# Patient Record
Sex: Female | Born: 1950 | Race: White | Hispanic: No | Marital: Married | Smoking: Former smoker
Health system: Southern US, Community
[De-identification: ages and names within clinical notes are randomized; demographics above are authoritative.]

## PROBLEM LIST (undated history)

## (undated) DIAGNOSIS — I209 Angina pectoris, unspecified: Secondary | ICD-10-CM

## (undated) DIAGNOSIS — K219 Gastro-esophageal reflux disease without esophagitis: Secondary | ICD-10-CM

## (undated) DIAGNOSIS — E039 Hypothyroidism, unspecified: Secondary | ICD-10-CM

## (undated) DIAGNOSIS — R51 Headache: Secondary | ICD-10-CM

## (undated) DIAGNOSIS — C801 Malignant (primary) neoplasm, unspecified: Secondary | ICD-10-CM

## (undated) DIAGNOSIS — I10 Essential (primary) hypertension: Secondary | ICD-10-CM

## (undated) DIAGNOSIS — F32A Depression, unspecified: Secondary | ICD-10-CM

## (undated) DIAGNOSIS — F329 Major depressive disorder, single episode, unspecified: Secondary | ICD-10-CM

## (undated) DIAGNOSIS — M1990 Unspecified osteoarthritis, unspecified site: Secondary | ICD-10-CM

## (undated) HISTORY — PX: AUGMENTATION MAMMAPLASTY: SUR837

## (undated) HISTORY — PX: BACK SURGERY: SHX140

## (undated) HISTORY — PX: ABDOMINAL HYSTERECTOMY: SHX81

## (undated) HISTORY — PX: APPENDECTOMY: SHX54

## (undated) HISTORY — PX: ENDOMETRIAL FULGURATION: SHX1500

---

## 1979-01-16 DIAGNOSIS — C801 Malignant (primary) neoplasm, unspecified: Secondary | ICD-10-CM

## 1979-01-16 HISTORY — DX: Malignant (primary) neoplasm, unspecified: C80.1

## 2004-05-08 ENCOUNTER — Other Ambulatory Visit: Admission: RE | Admit: 2004-05-08 | Discharge: 2004-05-08 | Payer: Self-pay | Admitting: Obstetrics and Gynecology

## 2005-01-22 ENCOUNTER — Inpatient Hospital Stay (HOSPITAL_COMMUNITY): Admission: EM | Admit: 2005-01-22 | Discharge: 2005-01-23 | Payer: Self-pay | Admitting: Emergency Medicine

## 2005-04-20 ENCOUNTER — Encounter: Admission: RE | Admit: 2005-04-20 | Discharge: 2005-04-20 | Payer: Self-pay | Admitting: Family Medicine

## 2006-11-26 ENCOUNTER — Encounter: Admission: RE | Admit: 2006-11-26 | Discharge: 2006-11-26 | Payer: Self-pay | Admitting: Family Medicine

## 2008-02-06 ENCOUNTER — Encounter: Admission: RE | Admit: 2008-02-06 | Discharge: 2008-02-06 | Payer: Self-pay | Admitting: Family Medicine

## 2008-03-20 ENCOUNTER — Ambulatory Visit: Payer: Self-pay | Admitting: Interventional Radiology

## 2008-03-20 ENCOUNTER — Emergency Department (HOSPITAL_BASED_OUTPATIENT_CLINIC_OR_DEPARTMENT_OTHER): Admission: EM | Admit: 2008-03-20 | Discharge: 2008-03-20 | Payer: Self-pay | Admitting: Emergency Medicine

## 2009-03-23 ENCOUNTER — Encounter: Admission: RE | Admit: 2009-03-23 | Discharge: 2009-03-23 | Payer: Self-pay | Admitting: Family Medicine

## 2010-04-27 LAB — POCT CARDIAC MARKERS
Myoglobin, poc: 60.4 ng/mL (ref 12–200)
Troponin i, poc: <0.05 ng/mL (ref 0.00–0.09)

## 2010-04-27 LAB — BASIC METABOLIC PANEL
BUN: 20 mg/dL (ref 6–23)
Calcium: 9.4 mg/dL (ref 8.4–10.5)
Creatinine, Ser: 1 mg/dL (ref 0.4–1.2)
Glucose, Bld: 91 mg/dL (ref 70–99)
Sodium: 141 mEq/L (ref 135–145)

## 2010-04-27 LAB — CBC
HCT: 41.5 % (ref 36.0–46.0)
Hemoglobin: 14.1 g/dL (ref 12.0–15.0)
MCV: 89.2 fL (ref 78.0–100.0)

## 2010-04-27 LAB — DIFFERENTIAL
Eosinophils Absolute: 0.3 10*3/uL (ref 0.0–0.7)
Eosinophils Relative: 3 % (ref 0–5)
Lymphocytes Relative: 36 % (ref 12–46)
Monocytes Absolute: 0.8 10*3/uL (ref 0.1–1.0)
Monocytes Relative: 8 % (ref 3–12)

## 2010-06-02 NOTE — H&P (Signed)
NAMERegan Gilbert               ACCOUNT NO.:  [id number]   MEDICAL RECORD NO.:  [id number]          PATIENT TYPE:  INP   LOCATION:  3705                         FACILITY:  [facility]   PHYSICIAN:  Lyn Records, M.D.   DATE OF BIRTH:  07/11/1950   DATE OF ADMISSION:  01/22/2005  DATE OF DISCHARGE:                                HISTORY & PHYSICAL   REASON FOR ADMISSION:  Chest pressure.   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  She states that last evening while in church and then  again this morning she developed discomfort in her mid chest that radiated  into the left shoulder.  This a.m. it occurred while she was driving her car  to work and had persisted for nearly two hours.  She decided to come to the  emergency room.  In the ER there was no evidence of EKG abnormality or point  of care marker abnormality.  She is admitted because of her history.  There  is a prior evaluation for chest pain that culminated on the cardiac  catheterization six years ago in [place] that was normal.   ALLERGIES:  None.   MEDICATIONS:  1.  Neurontin 1600 mg b.i.d.  2.  Lotrel 5/10 mg per day.  3.  Estrace dose unknown.  4.  Wellbutrin 150 mg b.i.d.  5.  Synthroid 0.5 mg per day.   SIGNIFICANT MEDICAL PROBLEMS:  1.  History of migraines.  2.  Mitral valve prolapse.  3.  Hypertension.  4.  Back pain.   FAMILY HISTORY:  Father died of esophageal cancer at 63.  Mother died at 33  of liver cancer.   REVIEW OF SYSTEMS:  A persisting upper respiratory congestion and drainage  in the past four weeks, otherwise unremarkable.  She does have headaches  from time to time related to her migraines.  The diagnosis of mitral valve  prolapse was made six years ago when she presented with chest pain and had a  normal catheterization.  This was done in [place].   OBJECTIVE:  GENERAL:  Patient is in no acute distress.  She is having some  lingering  chest discomfort.  She states that after some sublingual  nitroglycerin __________ got better.  VITAL SIGNS:  Heart rate 68, respirations 16, blood pressure 110/70.  HEENT:  Unremarkable.  Extraocular movements are full.  NECK:  JVD and carotid bruits.  LUNGS:  Clear to auscultation and percussion.  CARDIAC:  No gallop or rub or click.  No murmur.  ABDOMEN:  Soft.  Bowel sounds normal.  EXTREMITIES:  No edema.  Posterior tibial and radial pulses 2+.  NEUROLOGIC:  Unremarkable.   Chest x-ray is unremarkable.   LABORATORY DATA:  Unremarkable.  EKG is unremarkable.   ASSESSMENT:  Chest pain.  Admit for observation to rule out myocardial  infarction.   PLAN:  Serial enzymes.  Consider stress testing.   ADDENDUM:   HABITS:  Patient smokes cigarettes.  She denies ethanol consumption.      Lyn Records,  M.D.  Electronically Signed     HWS/MEDQ  D:  01/23/2005  T:  01/23/2005  Job:  [id number]   cc:   Dr. Tamera Stands  [place], [place]

## 2010-06-02 NOTE — Discharge Summary (Signed)
NAMERegan Gilbert               ACCOUNT NO.:  [id number]   MEDICAL RECORD NO.:  [id number]          PATIENT TYPE:  INP   LOCATION:  3705                         FACILITY:  [facility]   PHYSICIAN:  Lyn Records, M.D.   DATE OF BIRTH:  25-Oct-1950   DATE OF ADMISSION:  01/22/2005  DATE OF DISCHARGE:  01/23/2005                                 DISCHARGE SUMMARY   DISCHARGE DIAGNOSIS:  1.  Prolonged chest pain, myocardial infarction ruled out, Cardiolite study      negative for ischemia.  2.  History of mitral valve prolapse.  3.  History of migraines.   DIAGNOSTIC STUDIES:  Stress Cardiolite January 23, 2005.   DISCHARGE INSTRUCTIONS:  The patient is to follow up with her primary care  physician, Dr. Tresa Endo.  The patient is advised to take aspirin 81 mg daily.  The patient is advised to discontinue smoking.  The patient is advised to  remain on her previously prescribed medications including Synthroid as  before, Neurontin 1600 mg b.i.d., Wellbutrin 150 mg b.i.d., Lotrel as  before, Estrace as before, and as mentioned above, coated aspirin 81 mg is  added.  The patient is advised to call Dr. Landry Dyke office to make an  appointment.  The patient is advised to re-contact Dr. Katrinka Blazing if there is  significant recurrence of chest pain.   CONDITION ON DISCHARGE:  Improved.   ACTIVITIES:  No restrictions.   HOSPITAL COURSE:  Please refer to the admitting history and physical.  The  patient exercised on the Bruce protocol and performed a stress Cardiolite  study on January 23, 2005.  Exercise tolerance was excellent, there was no  reproduction of any type of chest pain symptoms with exercise, no EKG  evidence of ischemia was noted.  The patient's nitroglycerin was  discontinued, she ambulated in the hall, and was discharged with the  instructions as noted above.  She does have a history of mitral valve  prolapse and it is felt that, perhaps, some of her chest pain could be  related to that,  although there is no evidence of mitral valve prolapse and  an echo was not repeated.  We felt it best for the patient to follow up with  her primary care physician to consider possible other causes.  Her  medications other than the addition of aspirin were not changed.  We  encouraged her to stop smoking.      Lyn Records, M.D.  Electronically Signed     HWS/MEDQ  D:  01/23/2005  T:  01/23/2005  Job:  [id number]   cc:   Dr. Tresa Endo

## 2010-07-14 ENCOUNTER — Other Ambulatory Visit: Payer: Self-pay | Admitting: Family Medicine

## 2010-07-14 DIAGNOSIS — Z1231 Encounter for screening mammogram for malignant neoplasm of breast: Secondary | ICD-10-CM

## 2010-07-24 ENCOUNTER — Ambulatory Visit
Admission: RE | Admit: 2010-07-24 | Discharge: 2010-07-24 | Disposition: A | Discharge status: Home / Self Care | Payer: 59 | Source: Ambulatory Visit | Attending: Family Medicine | Admitting: Family Medicine

## 2010-07-24 DIAGNOSIS — Z1231 Encounter for screening mammogram for malignant neoplasm of breast: Secondary | ICD-10-CM

## 2011-11-01 ENCOUNTER — Other Ambulatory Visit: Payer: Self-pay | Admitting: Family Medicine

## 2011-11-01 DIAGNOSIS — Z1231 Encounter for screening mammogram for malignant neoplasm of breast: Secondary | ICD-10-CM

## 2011-12-04 ENCOUNTER — Ambulatory Visit
Admission: RE | Admit: 2011-12-04 | Discharge: 2011-12-04 | Disposition: A | Discharge status: Home / Self Care | Payer: Medicare Other | Source: Ambulatory Visit | Attending: Family Medicine | Admitting: Family Medicine

## 2011-12-04 DIAGNOSIS — Z1231 Encounter for screening mammogram for malignant neoplasm of breast: Secondary | ICD-10-CM

## 2012-02-16 HISTORY — PX: HIP SURGERY: SHX245

## 2012-11-04 ENCOUNTER — Encounter (HOSPITAL_COMMUNITY): Payer: Self-pay | Admitting: Pharmacy Technician

## 2012-11-05 ENCOUNTER — Encounter (HOSPITAL_COMMUNITY)
Admission: RE | Admit: 2012-11-05 | Discharge: 2012-11-05 | Disposition: A | Discharge status: Home / Self Care | Payer: Medicare Other | Source: Ambulatory Visit | Attending: Orthopedic Surgery | Admitting: Orthopedic Surgery

## 2012-11-05 ENCOUNTER — Encounter (HOSPITAL_COMMUNITY): Payer: Self-pay

## 2012-11-05 DIAGNOSIS — Z01818 Encounter for other preprocedural examination: Secondary | ICD-10-CM | POA: Insufficient documentation

## 2012-11-05 DIAGNOSIS — Z01812 Encounter for preprocedural laboratory examination: Secondary | ICD-10-CM | POA: Insufficient documentation

## 2012-11-05 HISTORY — DX: Angina pectoris, unspecified: I20.9

## 2012-11-05 HISTORY — DX: Major depressive disorder, single episode, unspecified: F32.9

## 2012-11-05 HISTORY — DX: Depression, unspecified: F32.A

## 2012-11-05 HISTORY — DX: Hypothyroidism, unspecified: E03.9

## 2012-11-05 HISTORY — DX: Essential (primary) hypertension: I10

## 2012-11-05 HISTORY — DX: Malignant (primary) neoplasm, unspecified: C80.1

## 2012-11-05 HISTORY — DX: Unspecified osteoarthritis, unspecified site: M19.90

## 2012-11-05 HISTORY — DX: Headache: R51

## 2012-11-05 HISTORY — DX: Gastro-esophageal reflux disease without esophagitis: K21.9

## 2012-11-05 LAB — BASIC METABOLIC PANEL
CO2: 29 mEq/L (ref 19–32)
Chloride: 101 mEq/L (ref 96–112)
Creatinine, Ser: 1.02 mg/dL (ref 0.50–1.10)
GFR calc Af Amer: 67 mL/min — ABNORMAL LOW (ref >90)
Potassium: 4.1 mEq/L (ref 3.5–5.1)
Sodium: 135 mEq/L (ref 135–145)

## 2012-11-05 LAB — URINALYSIS, ROUTINE W REFLEX MICROSCOPIC
Glucose, UA: NEGATIVE mg/dL
Ketones, ur: NEGATIVE mg/dL
Leukocytes, UA: NEGATIVE
Protein, ur: NEGATIVE mg/dL
Urobilinogen, UA: 0.2 mg/dL (ref 0.0–1.0)

## 2012-11-05 LAB — CBC
HCT: 39 % (ref 36.0–46.0)
Hemoglobin: 13.1 g/dL (ref 12.0–15.0)
MCV: 85.7 fL (ref 78.0–100.0)
RBC: 4.55 MIL/uL (ref 3.87–5.11)
RDW: 12.5 % (ref 11.5–15.5)
WBC: 6.6 10*3/uL (ref 4.0–10.5)

## 2012-11-05 LAB — APTT: aPTT: 29 seconds (ref 24–37)

## 2012-11-05 LAB — SURGICAL PCR SCREEN: Staphylococcus aureus: NEGATIVE

## 2012-11-05 LAB — PROTIME-INR: INR: 0.91 (ref 0.00–1.49)

## 2012-11-05 NOTE — Progress Notes (Signed)
Surgery clearance note Dr. Tresa Endo 10/08/12 on chart, EKG 10/07/12 on chart, Chest x-ray 10/07/12 on chart, LOV note 10/07/12 Dr. Tresa Endo on chart

## 2012-11-05 NOTE — Patient Instructions (Addendum)
20 Jasmine Gilbert  11/05/2012   Your procedure is scheduled on: 11/11/12  Report to [facility] at 5:15 AM.  Call this number if you have problems the morning of surgery [phone]: [phone]   Remember:   Do not eat food or drink liquids After Midnight.     Take these medicines the morning of surgery with A SIP OF WATER: wellbutrin, sertraline, gabapentin, synthroid   Do not wear jewelry, make-up or nail polish.  Do not wear lotions, powders, or perfumes. You may wear deodorant.  Do not shave 48 hours prior to surgery. Men may shave face and neck.  Do not bring valuables to the hospital.  Contacts, dentures or bridgework may not be worn into surgery.  Leave suitcase in the car. After surgery it may be brought to your room.  For patients admitted to the hospital, checkout time is 11:00 AM the day of discharge.    Please read over the following fact sheets that you were given: MRSA Information, incentive spirometry fact sheet, blood fact sheet Birdie Sons, RN  pre op nurse call if needed [phone]    FAILURE TO FOLLOW THESE INSTRUCTIONS MAY RESULT IN CANCELLATION OF YOUR SURGERY   Patient Signature: ___________________________________________

## 2012-11-09 NOTE — H&P (Signed)
TOTAL HIP ADMISSION H&P  Patient is admitted for right total hip arthroplasty, anterior appoach.  Subjective:  Chief Complaint: Right hip OA / pain  HPI: Jasmine Gilbert, 62 y.o. female, has a history of pain and functional disability in the right hip(s) due to arthritis and patient has failed non-surgical conservative treatments for greater than 12 weeks to include NSAID's and/or analgesics, corticosteriod injections, supervised PT with diminished ADL's post treatment and activity modification.  Onset of symptoms was gradual starting 1+ years ago with rapidlly worsening course since that time.The patient noted prior procedures of the hip to include labral repair on the right hip(s).  Patient currently rates pain in the right hip at 8 out of 10 with activity. Patient has night pain, worsening of pain with activity and weight bearing, trendelenberg gait, pain that interfers with activities of daily living and pain with passive range of motion. Patient has evidence of periarticular osteophytes and joint space narrowing by imaging studies. This condition presents safety issues increasing the risk of falls. There is no current active infection.  Risks, benefits and expectations were discussed with the patient. Patient understand the risks, benefits and expectations and wishes to proceed with surgery.   D/C Plans:   Home with HHPT  Post-op Meds:    No Rx given   Tranexamic Acid:   To be given  Decadron:    To be given  FYI:    ASA post-op  Oxycotin with Oxycodone post-op    Past Medical History  Diagnosis Date  . Anginal pain   . Hypertension   . Hypothyroidism   . Depression   . GERD (gastroesophageal reflux disease)   . Arthritis   . Cancer 1981    cervical  . Headache(784.0)     injections every 3months    Past Surgical History  Procedure Laterality Date  . Appendectomy  1970's    ruptured  . Hip surgery Right feb 2014    repaired labral tear, removed spurs, shaved socket,  repaired tendon  . 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 Abdominal hysterectomy  1980's  . Endometrial fulguration  70's and 80's    x4  . Cardiac catheterization  1990's    No prescriptions prior to admission   Allergies  Allergen Reactions  . Sulfa Antibiotics Hives    History  Substance Use Topics  . Smoking status: Former Smoker -- 0.50 packs/day for 2 years    Types: Cigarettes    Quit date: 01/16/2003  . Smokeless tobacco: Never Used  . Alcohol Use: Yes     Comment: occasional    No family history on file.   Review of Systems  Constitutional: Negative.   Eyes: Negative.   Respiratory: Negative.   Cardiovascular: Negative.   Gastrointestinal: Positive for heartburn.  Genitourinary: Negative.   Musculoskeletal: Positive for joint pain.  Skin: Negative.   Neurological: Positive for headaches.  Endo/Heme/Allergies: Negative.   Psychiatric/Behavioral: Positive for depression.    Objective:  Physical Exam  Constitutional: She appears well-developed and well-nourished.  HENT:  Head: Normocephalic and atraumatic.  Mouth/Throat: Oropharynx is clear and moist.  Eyes: Pupils are equal, round, and reactive to light.  Neck: Neck supple. No JVD present. No tracheal deviation present. No thyromegaly present.  Cardiovascular: Normal rate, regular rhythm, normal heart sounds and intact distal pulses.   Respiratory: Effort normal and breath sounds normal. No stridor. No respiratory distress. She has no  wheezes.  GI: Soft. There is no tenderness. There is no guarding.  Musculoskeletal:       Right hip: She exhibits decreased range of motion, decreased strength and tenderness. She exhibits no swelling, no deformity and no laceration.  Lymphadenopathy:    She has no cervical adenopathy.  Skin: Skin is warm and dry.  Psychiatric: She has a normal mood and affect.     Imaging Review Plain radiographs demonstrate severe degenerative  joint disease of the right hip(s). The bone quality appears to be good for age and reported activity level.  Assessment/Plan:  End stage arthritis, right hip(s)  The patient history, physical examination, clinical judgement of the provider and imaging studies are consistent with end stage degenerative joint disease of the right hip(s) and total hip arthroplasty is deemed medically necessary. The treatment options including medical management, injection therapy, arthroscopy and arthroplasty were discussed at length. The risks and benefits of total hip arthroplasty were presented and reviewed. The risks due to aseptic loosening, infection, stiffness, dislocation/subluxation,  thromboembolic complications and other imponderables were discussed.  The patient acknowledged the explanation, agreed to proceed with the plan and consent was signed. Patient is being admitted for inpatient treatment for surgery, pain control, PT, OT, prophylactic antibiotics, VTE prophylaxis, progressive ambulation and ADL's and discharge planning.The patient is planning to be discharged home with home health services.     Jasmine Gilbert Jasmine Gilbert   PAC  11/09/2012, 8:45 PM

## 2012-11-11 ENCOUNTER — Inpatient Hospital Stay (HOSPITAL_COMMUNITY): Payer: Medicare Other

## 2012-11-11 ENCOUNTER — Encounter (HOSPITAL_COMMUNITY): Payer: Self-pay

## 2012-11-11 ENCOUNTER — Inpatient Hospital Stay (HOSPITAL_COMMUNITY): Payer: Medicare Other | Admitting: Anesthesiology

## 2012-11-11 ENCOUNTER — Inpatient Hospital Stay (HOSPITAL_COMMUNITY)
Admission: RE | Admit: 2012-11-11 | Discharge: 2012-11-12 | DRG: 470 | Disposition: A | Discharge status: Home Health | Payer: Medicare Other | Source: Ambulatory Visit | Attending: Orthopedic Surgery | Admitting: Orthopedic Surgery

## 2012-11-11 ENCOUNTER — Encounter (HOSPITAL_COMMUNITY): Payer: Medicare Other | Admitting: Anesthesiology

## 2012-11-11 ENCOUNTER — Encounter (HOSPITAL_COMMUNITY)
Admission: RE | Disposition: A | Discharge status: Home Health | Payer: Self-pay | Source: Ambulatory Visit | Attending: Orthopedic Surgery

## 2012-11-11 DIAGNOSIS — I10 Essential (primary) hypertension: Secondary | ICD-10-CM | POA: Diagnosis present

## 2012-11-11 DIAGNOSIS — Z87891 Personal history of nicotine dependence: Secondary | ICD-10-CM

## 2012-11-11 DIAGNOSIS — M169 Osteoarthritis of hip, unspecified: Principal | ICD-10-CM | POA: Diagnosis present

## 2012-11-11 DIAGNOSIS — Z96649 Presence of unspecified artificial hip joint: Secondary | ICD-10-CM

## 2012-11-11 DIAGNOSIS — D62 Acute posthemorrhagic anemia: Secondary | ICD-10-CM | POA: Diagnosis not present

## 2012-11-11 DIAGNOSIS — E039 Hypothyroidism, unspecified: Secondary | ICD-10-CM | POA: Diagnosis present

## 2012-11-11 DIAGNOSIS — M1610 Unilateral primary osteoarthritis, unspecified hip: Principal | ICD-10-CM | POA: Diagnosis present

## 2012-11-11 DIAGNOSIS — K219 Gastro-esophageal reflux disease without esophagitis: Secondary | ICD-10-CM | POA: Diagnosis present

## 2012-11-11 DIAGNOSIS — Z01812 Encounter for preprocedural laboratory examination: Secondary | ICD-10-CM

## 2012-11-11 HISTORY — PX: TOTAL HIP ARTHROPLASTY: SHX124

## 2012-11-11 LAB — TYPE AND SCREEN
ABO/RH(D): O POS
Antibody Screen: NEGATIVE

## 2012-11-11 SURGERY — ARTHROPLASTY, HIP, TOTAL, ANTERIOR APPROACH
Anesthesia: Spinal | Site: Hip | Laterality: Right | Wound class: Clean

## 2012-11-11 MED ORDER — CEFAZOLIN SODIUM-DEXTROSE 2-3 GM-% IV SOLR
INTRAVENOUS | Status: AC
  Filled 2012-11-11: qty 50

## 2012-11-11 MED ORDER — GABAPENTIN 800 MG PO TABS: 800.0000 mg | ORAL_TABLET | Freq: Two times a day (BID) | ORAL | Status: DC

## 2012-11-11 MED ORDER — TOPIRAMATE 100 MG PO TABS
200.0000 mg | ORAL_TABLET | Freq: Every day | ORAL | Status: DC
  Administered 2012-11-11: 21:00:00 200 mg via ORAL
  Filled 2012-11-11 (×2): qty 2

## 2012-11-11 MED ORDER — DEXAMETHASONE SODIUM PHOSPHATE 10 MG/ML IJ SOLN
10.0000 mg | Freq: Once | INTRAMUSCULAR | Status: AC
  Administered 2012-11-11: 10 mg via INTRAVENOUS

## 2012-11-11 MED ORDER — ONDANSETRON HCL 4 MG PO TABS: 4.0000 mg | ORAL_TABLET | Freq: Four times a day (QID) | ORAL | Status: DC | PRN

## 2012-11-11 MED ORDER — MIDAZOLAM HCL 5 MG/5ML IJ SOLN
INTRAMUSCULAR | Status: DC | PRN
  Administered 2012-11-11: 2 mg via INTRAVENOUS

## 2012-11-11 MED ORDER — SUCRALFATE 1 GM/10ML PO SUSP
1.0000 g | Freq: Four times a day (QID) | ORAL | Status: DC | PRN
  Filled 2012-11-11: qty 10

## 2012-11-11 MED ORDER — BUPIVACAINE HCL (PF) 0.5 % IJ SOLN
INTRAMUSCULAR | Status: AC
  Filled 2012-11-11: qty 30

## 2012-11-11 MED ORDER — 0.9 % SODIUM CHLORIDE (POUR BTL) OPTIME
TOPICAL | Status: DC | PRN
  Administered 2012-11-11: 1000 mL

## 2012-11-11 MED ORDER — HYDROMORPHONE HCL PF 1 MG/ML IJ SOLN
INTRAMUSCULAR | Status: AC
  Administered 2012-11-11: 1 mg via INTRAVENOUS
  Filled 2012-11-11: qty 1

## 2012-11-11 MED ORDER — CEFAZOLIN SODIUM-DEXTROSE 2-3 GM-% IV SOLR
2.0000 g | INTRAVENOUS | Status: AC
  Administered 2012-11-11: 2 g via INTRAVENOUS

## 2012-11-11 MED ORDER — ALUM & MAG HYDROXIDE-SIMETH 200-200-20 MG/5ML PO SUSP: 30.0000 mL | ORAL | Status: DC | PRN

## 2012-11-11 MED ORDER — ONDANSETRON HCL 4 MG/2ML IJ SOLN: 4.0000 mg | Freq: Four times a day (QID) | INTRAMUSCULAR | Status: DC | PRN

## 2012-11-11 MED ORDER — MENTHOL 3 MG MT LOZG: 1.0000 | LOZENGE | OROMUCOSAL | Status: DC | PRN

## 2012-11-11 MED ORDER — TRANEXAMIC ACID 100 MG/ML IV SOLN
1000.0000 mg | Freq: Once | INTRAVENOUS | Status: AC
  Administered 2012-11-11: 1000 mg via INTRAVENOUS
  Filled 2012-11-11: qty 10

## 2012-11-11 MED ORDER — ACETAMINOPHEN 500 MG PO TABS
1000.0000 mg | ORAL_TABLET | Freq: Four times a day (QID) | ORAL | Status: AC
  Administered 2012-11-11 – 2012-11-12 (×4): 1000 mg via ORAL
  Filled 2012-11-11 (×4): qty 2

## 2012-11-11 MED ORDER — METHOCARBAMOL 100 MG/ML IJ SOLN
500.0000 mg | Freq: Four times a day (QID) | INTRAVENOUS | Status: DC | PRN
  Administered 2012-11-11: 500 mg via INTRAVENOUS
  Filled 2012-11-11: qty 5

## 2012-11-11 MED ORDER — FENTANYL CITRATE 0.05 MG/ML IJ SOLN
INTRAMUSCULAR | Status: DC | PRN
  Administered 2012-11-11 (×2): 50 ug via INTRAVENOUS

## 2012-11-11 MED ORDER — SENNA 8.6 MG PO TABS
1.0000 | ORAL_TABLET | Freq: Two times a day (BID) | ORAL | Status: DC
  Administered 2012-11-11: 8.6 mg via ORAL
  Filled 2012-11-11: qty 1

## 2012-11-11 MED ORDER — METHOCARBAMOL 500 MG PO TABS
500.0000 mg | ORAL_TABLET | Freq: Four times a day (QID) | ORAL | Status: DC | PRN
  Administered 2012-11-11 – 2012-11-12 (×3): 500 mg via ORAL
  Filled 2012-11-11 (×3): qty 1

## 2012-11-11 MED ORDER — CEFAZOLIN SODIUM 1-5 GM-% IV SOLN
1.0000 g | Freq: Four times a day (QID) | INTRAVENOUS | Status: AC
  Administered 2012-11-11 (×2): 1 g via INTRAVENOUS
  Filled 2012-11-11 (×2): qty 50

## 2012-11-11 MED ORDER — PROMETHAZINE HCL 25 MG/ML IJ SOLN: 6.2500 mg | INTRAMUSCULAR | Status: DC | PRN

## 2012-11-11 MED ORDER — EPHEDRINE SULFATE 50 MG/ML IJ SOLN
INTRAMUSCULAR | Status: DC | PRN
  Administered 2012-11-11: 10 mg via INTRAVENOUS

## 2012-11-11 MED ORDER — AMLODIPINE BESYLATE 5 MG PO TABS
5.0000 mg | ORAL_TABLET | Freq: Once | ORAL | Status: AC
  Administered 2012-11-11: 5 mg via ORAL
  Filled 2012-11-11: qty 1

## 2012-11-11 MED ORDER — GABAPENTIN 400 MG PO CAPS
800.0000 mg | ORAL_CAPSULE | Freq: Two times a day (BID) | ORAL | Status: DC
  Administered 2012-11-11 – 2012-11-12 (×2): 800 mg via ORAL
  Filled 2012-11-11 (×3): qty 2

## 2012-11-11 MED ORDER — BUPIVACAINE HCL (PF) 0.5 % IJ SOLN
INTRAMUSCULAR | Status: DC | PRN
  Administered 2012-11-11: 12.5 mg

## 2012-11-11 MED ORDER — SERTRALINE HCL 100 MG PO TABS
100.0000 mg | ORAL_TABLET | Freq: Every day | ORAL | Status: DC
  Administered 2012-11-12: 09:00:00 100 mg via ORAL
  Filled 2012-11-11: qty 1

## 2012-11-11 MED ORDER — DIPHENHYDRAMINE HCL 12.5 MG/5ML PO ELIX: 25.0000 mg | ORAL_SOLUTION | Freq: Four times a day (QID) | ORAL | Status: DC | PRN

## 2012-11-11 MED ORDER — DOCUSATE SODIUM 100 MG PO CAPS
100.0000 mg | ORAL_CAPSULE | Freq: Two times a day (BID) | ORAL | Status: DC
  Administered 2012-11-11 – 2012-11-12 (×2): 100 mg via ORAL

## 2012-11-11 MED ORDER — PROPOFOL INFUSION 10 MG/ML OPTIME
INTRAVENOUS | Status: DC | PRN
  Administered 2012-11-11: 75 ug/kg/min via INTRAVENOUS

## 2012-11-11 MED ORDER — FERROUS SULFATE 325 (65 FE) MG PO TABS
325.0000 mg | ORAL_TABLET | Freq: Three times a day (TID) | ORAL | Status: DC
  Administered 2012-11-11 – 2012-11-12 (×2): 325 mg via ORAL
  Filled 2012-11-11 (×5): qty 1

## 2012-11-11 MED ORDER — ZOLPIDEM TARTRATE 5 MG PO TABS: 5.0000 mg | ORAL_TABLET | Freq: Every evening | ORAL | Status: DC | PRN

## 2012-11-11 MED ORDER — SODIUM CHLORIDE 0.9 % IV SOLN
INTRAVENOUS | Status: DC
  Administered 2012-11-11 (×2): via INTRAVENOUS
  Filled 2012-11-11 (×9): qty 1000

## 2012-11-11 MED ORDER — OXYCODONE HCL ER 20 MG PO T12A
60.0000 mg | EXTENDED_RELEASE_TABLET | Freq: Two times a day (BID) | ORAL | Status: DC
  Administered 2012-11-11 – 2012-11-12 (×2): 60 mg via ORAL
  Filled 2012-11-11 (×2): qty 3

## 2012-11-11 MED ORDER — PHENOL 1.4 % MT LIQD: 1.0000 | OROMUCOSAL | Status: DC | PRN

## 2012-11-11 MED ORDER — LACTATED RINGERS IV SOLN
INTRAVENOUS | Status: DC | PRN
  Administered 2012-11-11 (×2): via INTRAVENOUS

## 2012-11-11 MED ORDER — OXYCODONE HCL 5 MG PO TABS
10.0000 mg | ORAL_TABLET | ORAL | Status: DC | PRN
  Administered 2012-11-11 (×3): 15 mg via ORAL
  Administered 2012-11-11: 12:00:00 10 mg via ORAL
  Administered 2012-11-11: 5 mg via ORAL
  Administered 2012-11-12 (×2): 15 mg via ORAL
  Filled 2012-11-11 (×2): qty 2
  Filled 2012-11-11 (×5): qty 3

## 2012-11-11 MED ORDER — HYDROMORPHONE HCL PF 1 MG/ML IJ SOLN: 0.2500 mg | INTRAMUSCULAR | Status: DC | PRN

## 2012-11-11 MED ORDER — ONDANSETRON HCL 4 MG/2ML IJ SOLN
INTRAMUSCULAR | Status: DC | PRN
  Administered 2012-11-11: 4 mg via INTRAVENOUS

## 2012-11-11 MED ORDER — DEXAMETHASONE SODIUM PHOSPHATE 10 MG/ML IJ SOLN
10.0000 mg | Freq: Once | INTRAMUSCULAR | Status: AC
  Administered 2012-11-12: 09:00:00 10 mg via INTRAVENOUS
  Filled 2012-11-11: qty 1

## 2012-11-11 MED ORDER — LEVOTHYROXINE SODIUM 25 MCG PO TABS
25.0000 ug | ORAL_TABLET | Freq: Every day | ORAL | Status: DC
  Administered 2012-11-12: 08:00:00 25 ug via ORAL
  Filled 2012-11-11 (×2): qty 1

## 2012-11-11 MED ORDER — ASPIRIN EC 325 MG PO TBEC
325.0000 mg | DELAYED_RELEASE_TABLET | Freq: Two times a day (BID) | ORAL | Status: DC
  Administered 2012-11-11 – 2012-11-12 (×2): 325 mg via ORAL
  Filled 2012-11-11 (×4): qty 1

## 2012-11-11 MED ORDER — BUPROPION HCL ER (XL) 300 MG PO TB24
300.0000 mg | ORAL_TABLET | Freq: Every morning | ORAL | Status: DC
  Administered 2012-11-12: 300 mg via ORAL
  Filled 2012-11-11: qty 1

## 2012-11-11 MED ORDER — HYDROMORPHONE HCL PF 1 MG/ML IJ SOLN
0.5000 mg | INTRAMUSCULAR | Status: DC | PRN
  Administered 2012-11-11 – 2012-11-12 (×5): 1 mg via INTRAVENOUS
  Filled 2012-11-11 (×4): qty 1

## 2012-11-11 SURGICAL SUPPLY — 38 items
ADH SKN CLS APL DERMABOND .7 (GAUZE/BANDAGES/DRESSINGS) ×1
BAG SPEC THK2 15X12 ZIP CLS (MISCELLANEOUS)
BAG ZIPLOCK 12X15 (MISCELLANEOUS) IMPLANT
BLADE SAW SGTL 18X1.27X75 (BLADE) ×2 IMPLANT
CAPT HIP PF COP ×1 IMPLANT
DERMABOND ADVANCED (GAUZE/BANDAGES/DRESSINGS) ×1
DERMABOND ADVANCED .7 DNX12 (GAUZE/BANDAGES/DRESSINGS) ×1 IMPLANT
DRAPE C-ARM 42X120 X-RAY (DRAPES) ×2 IMPLANT
DRAPE STERI IOBAN 125X83 (DRAPES) ×2 IMPLANT
DRAPE U-SHAPE 47X51 STRL (DRAPES) ×6 IMPLANT
DRSG AQUACEL AG ADV 3.5X10 (GAUZE/BANDAGES/DRESSINGS) ×2 IMPLANT
DRSG TEGADERM 4X4.75 (GAUZE/BANDAGES/DRESSINGS) IMPLANT
DURAPREP 26ML APPLICATOR (WOUND CARE) ×2 IMPLANT
ELECT BLADE TIP CTD 4 INCH (ELECTRODE) ×2 IMPLANT
ELECT REM PT RETURN 9FT ADLT (ELECTROSURGICAL) ×2
ELECTRODE REM PT RTRN 9FT ADLT (ELECTROSURGICAL) ×1 IMPLANT
EVACUATOR 1/8 PVC DRAIN (DRAIN) IMPLANT
FACESHIELD LNG OPTICON STERILE (SAFETY) ×8 IMPLANT
GAUZE SPONGE 2X2 8PLY STRL LF (GAUZE/BANDAGES/DRESSINGS) IMPLANT
GLOVE BIOGEL PI IND STRL 7.5 (GLOVE) ×1 IMPLANT
GLOVE BIOGEL PI IND STRL 8 (GLOVE) ×1 IMPLANT
GLOVE BIOGEL PI INDICATOR 7.5 (GLOVE) ×1
GLOVE BIOGEL PI INDICATOR 8 (GLOVE) ×1
GLOVE ECLIPSE 8.0 STRL XLNG CF (GLOVE) ×2 IMPLANT
GLOVE ORTHO TXT STRL SZ7.5 (GLOVE) ×4 IMPLANT
GOWN BRE IMP PREV XXLGXLNG (GOWN DISPOSABLE) ×2 IMPLANT
GOWN PREVENTION PLUS LG XLONG (DISPOSABLE) ×2 IMPLANT
KIT BASIN OR (CUSTOM PROCEDURE TRAY) ×2 IMPLANT
PACK TOTAL JOINT (CUSTOM PROCEDURE TRAY) ×2 IMPLANT
PADDING CAST COTTON 6X4 STRL (CAST SUPPLIES) ×2 IMPLANT
SPONGE GAUZE 2X2 STER 10/PKG (GAUZE/BANDAGES/DRESSINGS)
SUCTION FRAZIER 12FR DISP (SUCTIONS) IMPLANT
SUT MNCRL AB 4-0 PS2 18 (SUTURE) ×2 IMPLANT
SUT VIC AB 1 CT1 36 (SUTURE) ×6 IMPLANT
SUT VIC AB 2-0 CT1 27 (SUTURE) ×4
SUT VIC AB 2-0 CT1 TAPERPNT 27 (SUTURE) ×2 IMPLANT
TOWEL OR 17X26 10 PK STRL BLUE (TOWEL DISPOSABLE) ×2 IMPLANT
TRAY FOLEY CATH 14FRSI W/METER (CATHETERS) ×2 IMPLANT

## 2012-11-11 NOTE — Progress Notes (Signed)
[facility]  [facility] is providing the following services: rw and commode  If patient discharges after hours, please call [phone].   Renard Hamper 11/11/2012, 3:51 PM

## 2012-11-11 NOTE — Anesthesia Preprocedure Evaluation (Signed)
Anesthesia Evaluation  Patient identified by MRN, date of birth, ID band Patient awake    Reviewed: Allergy & Precautions, H&P , NPO status , Patient's Chart, lab work & pertinent test results  Airway Mallampati: II TM Distance: >3 FB Neck ROM: Full    Dental no notable dental hx.    Pulmonary neg pulmonary ROS,  breath sounds clear to auscultation  Pulmonary exam normal       Cardiovascular hypertension, Pt. on medications Rhythm:Regular Rate:Normal     Neuro/Psych negative neurological ROS  negative psych ROS   GI/Hepatic negative GI ROS, Neg liver ROS,   Endo/Other  Hypothyroidism   Renal/GU negative Renal ROS  negative genitourinary   Musculoskeletal negative musculoskeletal ROS (+)   Abdominal   Peds negative pediatric ROS (+)  Hematology negative hematology ROS (+)   Anesthesia Other Findings   Reproductive/Obstetrics negative OB ROS                         Anesthesia Physical Anesthesia Plan  ASA: II  Anesthesia Plan: Spinal   Post-op Pain Management:    Induction: Intravenous  Airway Management Planned: Simple Face Mask  Additional Equipment:   Intra-op Plan:   Post-operative Plan:   Informed Consent: I have reviewed the patients History and Physical, chart, labs and discussed the procedure including the risks, benefits and alternatives for the proposed anesthesia with the patient or authorized representative who has indicated his/her understanding and acceptance.   Dental advisory given  Plan Discussed with: CRNA and Surgeon  Anesthesia Plan Comments:        Anesthesia Quick Evaluation  

## 2012-11-11 NOTE — Op Note (Signed)
NAME:  Jasmine Gilbert                ACCOUNT NO.: [id number]      MEDICAL RECORD NO.: [id number]      FACILITY:  [facility]      PHYSICIAN:  Durene Romans D  DATE OF BIRTH:  10/27/1950     DATE OF PROCEDURE:  11/11/2012                                 OPERATIVE REPORT         PREOPERATIVE DIAGNOSIS: Right  hip osteoarthritis.      POSTOPERATIVE DIAGNOSIS:  Right hip osteoarthritis.      PROCEDURE:  Right total hip replacement through an anterior approach   utilizing DePuy THR system, component size 50mm pinnacle cup, a size 32+4 neutral   Altrex liner, a size 5Hi Tri Lock stem with a 32+1 delta ceramic   ball.      SURGEON:  Madlyn Frankel. Charlann Boxer, M.D.      ASSISTANT:  Leilani Able, PA-C     ANESTHESIA:  Spinal.      SPECIMENS:  None.      COMPLICATIONS:  None.      BLOOD LOSS:  200 cc     DRAINS:  None.      INDICATION OF THE PROCEDURE:  Jasmine Gilbert is a 62 y.o. female who had   presented to office for evaluation of right hip pain.  She had been treated for labral pathology as well as femoral acetabular impingement only to have persistent right hip pain.  Radiographs revealed   progressive degenerative changes involving the  hip joint.  The patient had painful limited range of   motion significantly affecting their overall quality of life.  The patient was failing to    respond to conservative measures, and at this point was ready   to proceed with more definitive measures.  The patient has noted progressive   degenerative changes in his hip, progressive problems and dysfunction   with regarding the hip prior to surgery.  Consent was obtained for   benefit of pain relief.  Specific risk of infection, DVT, component   failure, dislocation, need for revision surgery, as well discussion of   the anterior versus posterior approach were reviewed.  Consent was   obtained for benefit of anterior pain relief through an anterior   approach.      PROCEDURE  IN DETAIL:  The patient was brought to operative theater.   Once adequate anesthesia, preoperative antibiotics, 2gm Ancef administered.   The patient was positioned supine on the OSI Hanna table.  Once adequate   padding of boney process was carried out, we had predraped out the hip, and  used fluoroscopy to confirm orientation of the pelvis and position.      The right hip was then prepped and draped from proximal iliac crest to   mid thigh with shower curtain technique.      Time-out was performed identifying the patient, planned procedure, and   extremity.     An incision was then made 2 cm distal and lateral to the   anterior superior iliac spine extending over the orientation of the   tensor fascia lata muscle and sharp dissection was carried down to the   fascia of the muscle and protractor placed in the soft tissues.  The fascia was then incised.  The muscle belly was identified and swept   laterally and retractor placed along the superior neck.  Following   cauterization of the circumflex vessels and removing some pericapsular   fat, a second cobra retractor was placed on the inferior neck.  A third   retractor was placed on the anterior acetabulum after elevating the   anterior rectus.  A L-capsulotomy was along the line of the   superior neck to the trochanteric fossa, then extended proximally and   distally.  Tag sutures were placed and the retractors were then placed   intracapsular.  We then identified the trochanteric fossa and   orientation of my neck cut, confirmed this radiographically   and then made a neck osteotomy with the femur on traction.  The femoral   head was removed without difficulty or complication.  Traction was let   off and retractors were placed posterior and anterior around the   acetabulum.      The labrum and foveal tissue were debrided.  I began reaming with a 45mm   reamer and reamed up to 49mm reamer with good bony bed preparation and a 50    cup was chosen.  The final 50mm Pinnacle cup was then impacted under fluoroscopy  to confirm the depth of penetration and orientation with respect to   abduction.  A screw was placed followed by the hole eliminator.  The final   32+4 neutral Altrex liner was impacted with good visualized rim fit.  The cup was positioned anatomically within the acetabular portion of the pelvis.      At this point, the femur was rolled at 80 degrees.  Further capsule was   released off the inferior aspect of the femoral neck.  I then   released the superior capsule proximally.  The hook was placed laterally   along the femur and elevated manually and held in position with the bed   hook.  The leg was then extended and adducted with the leg rolled to 100   degrees of external rotation.  Once the proximal femur was fully   exposed, I used a box osteotome to set orientation.  I then began   broaching with the starting chili pepper broach and passed this by hand and then broached up to 5.  With the 5 broach in place I chose a high offset neck and did a trial reduction.  The offset was appropriate, leg lengths   appeared to be equal, confirmed radiographically.   Given these findings, I went ahead and dislocated the hip, repositioned all   retractors and positioned the right hip in the extended and abducted position.  The final 5 Hi Tri Lock stem was   chosen and it was impacted down to the level of neck cut.  Based on this   and the trial reduction, a 32+1 delta ceramic ball was chosen and   impacted onto a clean and dry trunnion, and the hip was reduced.  The   hip had been irrigated throughout the case again at this point.  I did   reapproximate the superior capsular leaflet to the anterior leaflet   using #1 Vicryl.  The fascia of the   tensor fascia lata muscle was then reapproximated using #1 Vicryl and #1 Quill suture.  The   remaining wound was closed with 2-0 Vicryl and running 4-0 Monocryl.   The hip  was cleaned, dried, and dressed sterilely using Dermabond  and   Aquacel dressing.  She was then brought   to recovery room in stable condition tolerating the procedure well.    Leilani Able, PA-C was present for the entirety of the case involved from   preoperative positioning, perioperative retractor management, general   facilitation of the case, as well as primary wound closure as assistant.            Madlyn Frankel Charlann Boxer, M.D.            MDO/MEDQ  D:  11/07/2010  T:  11/07/2010  Job:  [id number]      Electronically Signed by Durene Romans M.D. on 11/13/2010 09:15:38 AM

## 2012-11-11 NOTE — Interval H&P Note (Signed)
History and Physical Interval Note:  11/11/2012 6:44 AM  Jasmine Gilbert  has presented today for surgery, with the diagnosis of right hip osteoarthritis  The various methods of treatment have been discussed with the patient and family. After consideration of risks, benefits and other options for treatment, the patient has consented to  Procedure(s): RIGHT TOTAL HIP ARTHROPLASTY ANTERIOR APPROACH (Right) as a surgical intervention .  The patient's history has been reviewed, patient examined, no change in status, stable for surgery.  I have reviewed the patient's chart and labs.  Questions were answered to the patient's satisfaction.     Shelda Pal

## 2012-11-11 NOTE — Evaluation (Signed)
Physical Therapy Evaluation Patient Details Name: Jasmine Gilbert MRN: [id number] DOB: [birth year] Today's Date: 11/11/2012 Time: 9528-4132 PT Time Calculation (min): 20 min  PT Assessment / Plan / Recommendation History of Present Illness  R DATHA  Clinical Impression  Pt reports pain increased walking. Had medication. Pt will benefit from PT to address problems. Pt palns DC tomorrow. Marland Kitchen   PT Assessment  Patient needs continued PT services    Follow Up Recommendations  Home health PT    Does the patient have the potential to tolerate intense rehabilitation      Barriers to Discharge        Equipment Recommendations  Rolling walker with 5" wheels    Recommendations for Other Services     Frequency 7X/week    Precautions / Restrictions Precautions Precautions: Fall   Pertinent Vitals/Pain Back and R thigh 7, RN aware,      Mobility  Bed Mobility Bed Mobility: Supine to Sit;Sitting - Scoot to Edge of Bed Supine to Sit: 4: Min assist;HOB elevated;With rails Sitting - Scoot to Edge of Bed: 4: Min guard Details for Bed Mobility Assistance: pt able to move herself well to sitting. Transfers Transfers: Sit to Stand;Stand to Sit Sit to Stand: 4: Min assist;From bed;With upper extremity assist Stand to Sit: To chair/3-in-1;4: Min assist;With armrests;With upper extremity assist Details for Transfer Assistance: cues for  R leg placement and hand placement. Ambulation/Gait Ambulation/Gait Assistance: 4: Min assist Ambulation Distance (Feet): 50 Feet Assistive device: Rolling walker Ambulation/Gait Assistance Details: cues for sequence. Pt is able to advance RLE Gait Pattern: Step-through pattern;Decreased step length - right;Decreased stance time - right Gait velocity: decreased    Exercises Total Joint Exercises Heel Slides: AAROM;Right;5 reps;Supine   PT Diagnosis: Difficulty walking;Acute pain  PT Problem List: Decreased strength;Decreased range of motion;Decreased  activity tolerance;Decreased mobility;Decreased safety awareness;Decreased knowledge of use of DME;Decreased knowledge of precautions;Pain PT Treatment Interventions: DME instruction;Gait training;Stair training;Functional mobility training;Therapeutic activities;Therapeutic exercise;Patient/family education     PT Goals(Current goals can be found in the care plan section) Acute Rehab PT Goals Patient Stated Goal: I want to walk without pain. PT Goal Formulation: With patient Time For Goal Achievement: 11/15/12 Potential to Achieve Goals: Good  Visit Information  Last PT Received On: 11/11/12 Assistance Needed: +1 History of Present Illness: R DATHA       Prior Functioning  Home Living Family/patient expects to be discharged to:: Private residence Living Arrangements: Spouse/significant other Available Help at Discharge: Family Type of Home: House Home Access: Stairs to enter Secretary/administrator of Steps: 3 or 5 Entrance Stairs-Rails: None Home Layout: Two level;1/2 bath on main level Alternate Level Stairs-Number of Steps: pt plans to stay on first flooor. will sleep on couch Home Equipment: Crutches Prior Function Level of Independence: Independent Comments: has bad back, goes to pain clinic. Had torn labrum repair in feb and was on crutches. Communication Communication: No difficulties    Cognition  Cognition Arousal/Alertness: Awake/alert Behavior During Therapy: WFL for tasks assessed/performed Overall Cognitive Status: Within Functional Limits for tasks assessed    Extremity/Trunk Assessment Upper Extremity Assessment Upper Extremity Assessment: Defer to OT evaluation Lower Extremity Assessment Lower Extremity Assessment: RLE deficits/detail RLE Deficits / Details: able to advance RLE, move leg to the edge.   Balance    End of Session PT - End of Session Activity Tolerance: Patient tolerated treatment well;Patient limited by pain Patient left: in chair;with  call bell/phone within reach Nurse Communication: Mobility status;Patient requests pain meds  GP     Rada Hay 11/11/2012, 5:49 PM Blanchard Kelch PT [phone]

## 2012-11-11 NOTE — Transfer of Care (Signed)
Immediate Anesthesia Transfer of Care Note  Patient: Jasmine Gilbert  Procedure(s) Performed: Procedure(s): RIGHT TOTAL HIP ARTHROPLASTY ANTERIOR APPROACH (Right)  Patient Location: PACU  Anesthesia Type:MAC and Spinal  Level of Consciousness: awake and alert   Airway & Oxygen Therapy: Patient Spontanous Breathing and Patient connected to face mask oxygen  Post-op Assessment: Report given to PACU RN and Post -op Vital signs reviewed and stable  Post vital signs: Reviewed and stable  Complications: No apparent anesthesia complications

## 2012-11-11 NOTE — Progress Notes (Signed)
Utilization review completed.  

## 2012-11-11 NOTE — Anesthesia Procedure Notes (Signed)

## 2012-11-11 NOTE — Plan of Care (Signed)
Problem: Consults Goal: Diagnosis- Total Joint Replacement Right anterior hip     

## 2012-11-11 NOTE — Anesthesia Postprocedure Evaluation (Signed)
  Anesthesia Post-op Note  Patient: Jasmine Gilbert  Procedure(s) Performed: Procedure(s) (LRB): RIGHT TOTAL HIP ARTHROPLASTY ANTERIOR APPROACH (Right)  Patient Location: PACU  Anesthesia Type: Spinal  Level of Consciousness: awake and alert   Airway and Oxygen Therapy: Patient Spontanous Breathing  Post-op Pain: mild  Post-op Assessment: Post-op Vital signs reviewed, Patient's Cardiovascular Status Stable, Respiratory Function Stable, Patent Airway and No signs of Nausea or vomiting  Last Vitals:  Filed Vitals:   11/11/12 0930  BP: 94/53  Pulse: 68  Temp: 36.3 C  Resp: 12    Post-op Vital Signs: stable   Complications: No apparent anesthesia complications

## 2012-11-12 ENCOUNTER — Encounter (HOSPITAL_COMMUNITY): Payer: Self-pay | Admitting: Orthopedic Surgery

## 2012-11-12 DIAGNOSIS — Z96649 Presence of unspecified artificial hip joint: Secondary | ICD-10-CM

## 2012-11-12 DIAGNOSIS — D62 Acute posthemorrhagic anemia: Secondary | ICD-10-CM | POA: Diagnosis not present

## 2012-11-12 LAB — BASIC METABOLIC PANEL
CO2: 23 mEq/L (ref 19–32)
Calcium: 8.5 mg/dL (ref 8.4–10.5)
Chloride: 112 mEq/L (ref 96–112)
GFR calc Af Amer: 60 mL/min — ABNORMAL LOW (ref >90)
GFR calc non Af Amer: 52 mL/min — ABNORMAL LOW (ref >90)
Glucose, Bld: 124 mg/dL — ABNORMAL HIGH (ref 70–99)
Potassium: 3.7 mEq/L (ref 3.5–5.1)
Sodium: 139 mEq/L (ref 135–145)

## 2012-11-12 LAB — CBC
Hemoglobin: 9.3 g/dL — ABNORMAL LOW (ref 12.0–15.0)
MCHC: 33.2 g/dL (ref 30.0–36.0)
Platelets: 184 10*3/uL (ref 150–400)
RBC: 3.25 MIL/uL — ABNORMAL LOW (ref 3.87–5.11)
WBC: 8.7 10*3/uL (ref 4.0–10.5)

## 2012-11-12 MED ORDER — FERROUS SULFATE 325 (65 FE) MG PO TABS: 325.0000 mg | ORAL_TABLET | Freq: Three times a day (TID) | ORAL | Status: AC

## 2012-11-12 MED ORDER — DSS 100 MG PO CAPS: 100.0000 mg | ORAL_CAPSULE | Freq: Two times a day (BID) | ORAL | Status: AC

## 2012-11-12 MED ORDER — OXYCODONE HCL 10 MG PO TABS: 10.0000 mg | ORAL_TABLET | ORAL | Status: AC | PRN

## 2012-11-12 MED ORDER — ASPIRIN 325 MG PO TBEC: 325.0000 mg | DELAYED_RELEASE_TABLET | Freq: Two times a day (BID) | ORAL | Status: AC

## 2012-11-12 MED ORDER — METHOCARBAMOL 500 MG PO TABS: 500.0000 mg | ORAL_TABLET | Freq: Four times a day (QID) | ORAL | Status: AC | PRN

## 2012-11-12 MED ORDER — OXYCODONE HCL ER 60 MG PO T12A: 60.0000 mg | EXTENDED_RELEASE_TABLET | Freq: Two times a day (BID) | ORAL | Status: AC

## 2012-11-12 NOTE — Progress Notes (Signed)
Physical Therapy Treatment Patient Details Name: Jasmine Gilbert MRN: [id number] DOB: [birth year] Today's Date: 11/12/2012 Time: 4098-1191 PT Time Calculation (min): 32 min  PT Assessment / Plan / Recommendation  History of Present Illness R DATHA   PT Comments   Pt is walking well. Ready for DC.  Follow Up Recommendations  Home health PT     Does the patient have the potential to tolerate intense rehabilitation     Barriers to Discharge        Equipment Recommendations  Rolling walker with 5" wheels    Recommendations for Other Services    Frequency 7X/week   Progress towards PT Goals Progress towards PT goals: Progressing toward goals  Plan Current plan remains appropriate    Precautions / Restrictions Precautions Precautions: Fall   Pertinent Vitals/Pain 7 hip, rn aware.    Mobility  Bed Mobility Bed Mobility: Sit to Supine Sit to Supine: 6: Modified independent (Device/Increase time) Details for Bed Mobility Assistance: pt able to move herself well  Transfers Sit to Stand: 6: Modified independent (Device/Increase time) Stand to Sit: 6: Modified independent (Device/Increase time) Ambulation/Gait Ambulation/Gait Assistance: 5: Supervision Ambulation Distance (Feet): 100 Feet Assistive device: Rolling walker Gait Pattern: Step-through pattern;Decreased step length - right;Decreased stance time - right Stairs: Yes Stairs Assistance: 4: Min guard Stair Management Technique: One rail Left;With crutches;Forwards;Step to pattern Number of Stairs: 3    Exercises Total Joint Exercises Quad Sets: AROM;Right;10 reps Short Arc Quad: AROM;Right;10 reps Heel Slides: AROM;Right;10 reps Hip ABduction/ADduction: AAROM;Right;10 reps Long Arc Quad: AROM;Right;10 reps   PT Diagnosis:    PT Problem List:   PT Treatment Interventions:     PT Goals (current goals can now be found in the care plan section)    Visit Information  Last PT Received On: 11/12/12 Assistance  Needed: +1 History of Present Illness: R DATHA    Subjective Data      Cognition  Cognition Arousal/Alertness: Awake/alert    Balance     End of Session PT - End of Session Activity Tolerance: Patient tolerated treatment well;Patient limited by pain Patient left: with call bell/phone within reach;in bed Nurse Communication: Mobility status;Patient requests pain meds   GP     Rada Hay 11/12/2012, 1:12 PM

## 2012-11-12 NOTE — Progress Notes (Signed)
Pt discharged home via family; Pt and family given and explained all discharge instructions, carenotes, and prescriptions; pt and family stated understanding and denied questions/concerns; all f/u appointments in place; IV removed without complicaitons; pt stable at time of discharge  

## 2012-11-12 NOTE — Evaluation (Signed)
Occupational Therapy Evaluation Patient Details Name: Jasmine Gilbert MRN: [id number] DOB: 1950/01/18 Today's Date: 11/12/2012 Time: 4098-1191 OT Time Calculation (min): 35 min  OT Assessment / Plan / Recommendation History of present illness R DATHA   Clinical Impression   Pt requires min guard to supervision for OOB mobility, limited by increased R hip pain.  Instructed pt in multiple uses of 3 in1, AE for LB ADL, transporting items with walker, and general safety.  Pt verbalized and/or demonstrated understanding of all education.  Declining HHOT.      OT Assessment  Patient does not need any further OT services    Follow Up Recommendations  No OT follow up;Supervision/Assistance - 24 hour    Barriers to Discharge      Equipment Recommendations  3 in 1 bedside comode    Recommendations for Other Services    Frequency       Precautions / Restrictions Precautions Precautions: Fall Restrictions Weight Bearing Restrictions: No   Pertinent Vitals/Pain 6-7/10, iced, pain meds provided by RN    ADL  Eating/Feeding: Independent Where Assessed - Eating/Feeding: Bed level Grooming: Wash/dry hands;Supervision/safety Where Assessed - Grooming: Unsupported standing Upper Body Bathing: Set up Where Assessed - Upper Body Bathing: Unsupported sitting Lower Body Bathing: Minimal assistance Where Assessed - Lower Body Bathing: Unsupported sitting;Supported sit to stand Upper Body Dressing: Set up Where Assessed - Upper Body Dressing: Supported sitting Lower Body Dressing: Minimal assistance Where Assessed - Lower Body Dressing: Unsupported sitting;Supported sit to stand Toilet Transfer: Supervision/safety Statistician Method: Sit to Barista: Materials engineer and Hygiene: Supervision/safety Where Assessed - Engineer, mining and Hygiene: Sit on 3-in-1 or toilet Equipment Used: Rolling  walker Transfers/Ambulation Related to ADLs: supervision, verbal cues for hand placement, moves very slowly due to pain ADL Comments: Practiced use of reacher and sock aide to donn and doff socks and simulated with pillowcase for pants.  Pt has a back brush at home. Declined practice, but verbally instructed in shower transfer.  Husband will supervise.      OT Diagnosis:    OT Problem List:   OT Treatment Interventions:     OT Goals(Current goals can be found in the care plan section) Acute Rehab OT Goals Patient Stated Goal: I want to walk without pain.  Visit Information  Last OT Received On: 11/12/12 Assistance Needed: +1 History of Present Illness: R DATHA       Prior Functioning     Home Living Family/patient expects to be discharged to:: Private residence Living Arrangements: Spouse/significant other Available Help at Discharge: Family Type of Home: House Home Access: Stairs to enter [employer] of Steps: 3 or 5 Entrance Stairs-Rails: None Home Layout: Two level;1/2 bath on main level Alternate Level Stairs-Number of Steps: pt plans to stay on first flooor. will sleep on couch Home Equipment: Crutches Prior Function Level of Independence: Independent Comments: has bad back, goes to pain clinic. Communication Communication: No difficulties Dominant Hand: Right         Vision/Perception Vision - History Baseline Vision: Wears glasses all the time   Cognition  Cognition Arousal/Alertness: Awake/alert Behavior During Therapy: WFL for tasks assessed/performed Overall Cognitive Status: Within Functional Limits for tasks assessed    Extremity/Trunk Assessment Upper Extremity Assessment Upper Extremity Assessment: Overall WFL for tasks assessed Lower Extremity Assessment Lower Extremity Assessment: Defer to PT evaluation Cervical / Trunk Assessment Cervical / Trunk Assessment: Normal     Mobility Bed Mobility Bed  Mobility: Supine to Sit;Sitting  - Scoot to Edge of Bed Supine to Sit: 6: Modified independent (Device/Increase time);HOB elevated Sitting - Scoot to Edge of Bed: 6: Modified independent (Device/Increase time) Details for Bed Mobility Assistance: pt able to move herself well to sitting. Transfers Transfers: Sit to Stand;Stand to Sit Sit to Stand: 4: Min guard;With upper extremity assist Stand to Sit: 4: Min guard;With upper extremity assist Details for Transfer Assistance: cues for  R leg placement and hand placement.     Exercise     Balance     End of Session OT - End of Session Activity Tolerance: Patient limited by pain Patient left: in chair;with call bell/phone within reach;with nursing/sitter in room Nurse Communication: Patient requests pain meds  GO     Evern Bio 11/12/2012, 9:31 AM [phone]

## 2012-11-12 NOTE — Progress Notes (Signed)
CSW consulted for SNF placement. PN reviewed . PT is recommending HHPT following hospital d/c . RNCM will assist with d/c planning. CSW is available to assist if plan changes and SNF is needed.  Cori Razor LCSW [phone]

## 2012-11-12 NOTE — Care Management Note (Signed)
    Page 1 of 2   11/12/2012     2:03:17 PM   CARE MANAGEMENT NOTE 11/12/2012  Patient:  Jasmine Gilbert, Jasmine Gilbert   Account Number:  [id number]  Date Initiated:  11/12/2012  Documentation initiated by:  Colleen Can  Subjective/Objective Assessment:   dx rt hip osteoarthritis; total hip replacemnt-anterior approach     Action/Plan:   CM spoke with patient. Plans are for patient to return to her home in [place] where spouse will be caregiver. Marland Kitchen She will need RW and 3n1. States Sherron Monday will provide HHpt services.   Anticipated DC Date:  11/12/2012   Anticipated DC Plan:  HOME W HOME HEALTH SERVICES  In-house referral  Clinical Social Worker      DC Planning Services  CM consult      [facility] Choice  HOME HEALTH  DURABLE MEDICAL EQUIPMENT   Choice offered to / List presented to:  C-1 Patient   DME arranged  3-N-1  Levan Hurst      DME agency  [facility]     HH arranged  HH-2 PT      [facility] agency  [facility]   Status of service:  Completed, signed off Medicare Important Message given?  NA - LOS <3 / Initial given by admissions (If response is "NO", the following Medicare IM given date fields will be blank) Date Medicare IM given:   Date Additional Medicare IM given:    Discharge Disposition:  HOME W HOME HEALTH SERVICES  Per UR Regulation:    If discussed at Long Length of Stay Meetings, dates discussed:    Comments:  11/12/2012 Colleen Can BSN RN CCM [phone] Genevieve Norlander will start [facility] services tomorrow 11/13/2012.  [employer] rep delivered DME to pt's room.

## 2012-11-12 NOTE — Progress Notes (Signed)
   Subjective: 1 Day Post-Op Procedure(s) (LRB): RIGHT TOTAL HIP ARTHROPLASTY ANTERIOR APPROACH (Right)   Patient reports pain as mild, pain well controlled. No events throughout the night. Ready to be discharged home.   Objective:   VITALS:   Filed Vitals:   11/12/12  BP: 92/53  Pulse: 68  Temp: 97.6 F (36.4 C)   Resp: 18    Neurovascular intact Dorsiflexion/Plantar flexion intact Incision: dressing C/D/I No cellulitis present Compartment soft  LABS  Recent Labs  11/12/12 0420  HGB 9.3*  HCT 28.0*  WBC 8.7  PLT 184     Recent Labs  11/12/12 0420  NA 139  K 3.7  BUN 15  CREATININE 1.11*  GLUCOSE 124*     Assessment/Plan: 1 Day Post-Op Procedure(s) (LRB): RIGHT TOTAL HIP ARTHROPLASTY ANTERIOR APPROACH (Right) HV drain d/c'ed Foley cath d/c'ed Advance diet Up with therapy D/C IV fluids Discharge home with home health Follow up in 2 weeks at [facility]. Follow up with OLIN,Teresa Nicodemus D in 2 weeks.  Contact information:  [facility] [address] [place] [address] [phone]    Expected ABLA  Treated with iron and will observe       Anastasio Auerbach. Avagrace Botelho   PAC  11/12/2012, 10:45 AM

## 2012-11-17 NOTE — Discharge Summary (Signed)
Physician Discharge Summary  Patient ID: Jasmine Gilbert MRN: [id number] DOB/AGE: [birth year] 62 y.o.  Admit date: 11/11/2012 Discharge date: 11/12/2012   Procedures:  Procedure(s) (LRB): RIGHT TOTAL HIP ARTHROPLASTY ANTERIOR APPROACH (Right)  Attending Physician:  Dr. Durene Romans   Admission Diagnoses:   Right hip OA / pain  Discharge Diagnoses:  Principal Problem:   S/P right THA, AA Active Problems:   Expected blood loss anemia  Past Medical History  Diagnosis Date  . Anginal pain   . Hypertension   . Hypothyroidism   . Depression   . GERD (gastroesophageal reflux disease)   . Arthritis   . Cancer 1981    cervical  . Headache(784.0)     injections every 3months    HPI: Jasmine Gilbert, 62 y.o. female, has a history of pain and functional disability in the right hip(s) due to arthritis and patient has failed non-surgical conservative treatments for greater than 12 weeks to include NSAID's and/or analgesics, corticosteriod injections, supervised PT with diminished ADL's post treatment and activity modification. Onset of symptoms was gradual starting 1+ years ago with rapidlly worsening course since that time.The patient noted prior procedures of the hip to include labral repair on the right hip(s). Patient currently rates pain in the right hip at 8 out of 10 with activity. Patient has night pain, worsening of pain with activity and weight bearing, trendelenberg gait, pain that interfers with activities of daily living and pain with passive range of motion. Patient has evidence of periarticular osteophytes and joint space narrowing by imaging studies. This condition presents safety issues increasing the risk of falls. There is no current active infection. Risks, benefits and expectations were discussed with the patient. Patient understand the risks, benefits and expectations and wishes to proceed with surgery.  PCP: Almedia Balls, MD   Discharged Condition: good  Hospital  Course:  Patient underwent the above stated procedure on 11/11/2012. Patient tolerated the procedure well and brought to the recovery room in good condition and subsequently to the floor.  POD #1 BP: 92/53 ; Pulse: 68 ; Temp: 97.6 F (36.4 C) ; Resp: 18  Pt's foley was removed, as well as the hemovac drain removed. IV was changed to a saline lock. Patient reports pain as mild, pain well controlled. No events throughout the night. Ready to be discharged home. Neurovascular intact, dorsiflexion/plantar flexion intact, incision: dressing C/D/I, no cellulitis present and compartment soft.   LABS  Basename    HGB  9.3  HCT  28.0    Discharge Exam: General appearance: alert, cooperative and no distress Extremities: Homans sign is negative, no sign of DVT, no edema, redness or tenderness in the calves or thighs and no ulcers, gangrene or trophic changes  Disposition:   Home-Health Care Svc with follow up in 2 weeks   Follow-up Information   Follow up with Shelda Pal, MD. Schedule an appointment as soon as possible for a visit in 2 weeks.   Specialty:  Orthopedic Surgery   Contact information:   [address] [place] [address] [phone]       Discharge Orders   Future Orders Complete By Expires   Call MD / Call 911  As directed    Comments:     If you experience chest pain or shortness of breath, CALL 911 and be transported to the hospital emergency room.  If you develope a fever above 101 F, pus (white drainage) or increased drainage or redness at the wound,  or calf pain, call your surgeon's office.   Change dressing  As directed    Comments:     Maintain surgical dressing for 10-14 days, then replace with 4x4 guaze and tape. Keep the area dry and clean.   Constipation Prevention  As directed    Comments:     Drink plenty of fluids.  Prune juice may be helpful.  You may use a stool softener, such as Colace (over the counter) 100 mg twice a day.  Use  MiraLax (over the counter) for constipation as needed.   Diet - low sodium heart healthy  As directed    Discharge instructions  As directed    Comments:     Maintain surgical dressing for 10-14 days, then replace with gauze and tape. Keep the area dry and clean until follow up. Follow up in 2 weeks at [facility]. Call with any questions or concerns.   Increase activity slowly as tolerated  As directed    TED hose  As directed    Comments:     Use stockings (TED hose) for 2 weeks on both leg(s).  You may remove them at night for sleeping.   Weight bearing as tolerated  As directed    Questions:     Laterality:     Extremity:          Medication List         amLODipine-benazepril 5-10 MG per capsule  Commonly known as:  LOTREL  Take 1 capsule by mouth every morning.     aspirin 325 MG EC tablet  Take 1 tablet (325 mg total) by mouth 2 (two) times daily.     B-complex with vitamin C tablet  Take 1 tablet by mouth daily.     buPROPion 300 MG 24 hr tablet  Commonly known as:  WELLBUTRIN XL  Take 300 mg by mouth every morning.     DSS 100 MG Caps  Take 100 mg by mouth 2 (two) times daily.     ferrous sulfate 325 (65 FE) MG tablet  Take 1 tablet (325 mg total) by mouth 3 (three) times daily after meals.     gabapentin 800 MG tablet  Commonly known as:  NEURONTIN  Take 800 mg by mouth 2 (two) times daily.     levothyroxine 25 MCG tablet  Commonly known as:  SYNTHROID, LEVOTHROID  Take 25 mcg by mouth daily before breakfast.     methocarbamol 500 MG tablet  Commonly known as:  ROBAXIN  Take 1 tablet (500 mg total) by mouth every 6 (six) hours as needed (muscle spasms).     Oxycodone HCl 10 MG Tabs  Take 1-1.5 tablets (10-15 mg total) by mouth every 4 (four) hours as needed for pain.     OxyCODONE HCl ER 60 MG T12a  Commonly known as:  OXYCONTIN  Take 60 mg by mouth 2 (two) times daily at 8 am and 10 pm.     sertraline 50 MG tablet  Commonly known as:   ZOLOFT  Take 100 mg by mouth daily.     sucralfate 1 GM/10ML suspension  Commonly known as:  CARAFATE  Take 1 g by mouth 4 (four) times daily as needed (pain and heart burn).     topiramate 200 MG tablet  Commonly known as:  TOPAMAX  Take 200 mg by mouth at bedtime.         Signed: Anastasio Auerbach. Kylee Umana   PAC  11/17/2012, 9:55 AM '

## 2013-10-13 ENCOUNTER — Other Ambulatory Visit: Payer: Self-pay

## 2013-10-13 DIAGNOSIS — Z1231 Encounter for screening mammogram for malignant neoplasm of breast: Secondary | ICD-10-CM

## 2013-10-27 ENCOUNTER — Ambulatory Visit
Admission: RE | Admit: 2013-10-27 | Discharge: 2013-10-27 | Disposition: A | Discharge status: Home / Self Care | Payer: Medicare Other | Source: Ambulatory Visit

## 2013-10-27 DIAGNOSIS — Z1231 Encounter for screening mammogram for malignant neoplasm of breast: Secondary | ICD-10-CM

## 2014-11-18 ENCOUNTER — Other Ambulatory Visit: Payer: Self-pay

## 2014-11-18 DIAGNOSIS — Z9882 Breast implant status: Secondary | ICD-10-CM

## 2014-11-18 DIAGNOSIS — Z1231 Encounter for screening mammogram for malignant neoplasm of breast: Secondary | ICD-10-CM

## 2014-12-13 ENCOUNTER — Ambulatory Visit: Payer: Medicare Other

## 2015-03-14 ENCOUNTER — Encounter (HOSPITAL_BASED_OUTPATIENT_CLINIC_OR_DEPARTMENT_OTHER): Payer: Self-pay | Admitting: *Deleted

## 2015-03-14 ENCOUNTER — Emergency Department (HOSPITAL_BASED_OUTPATIENT_CLINIC_OR_DEPARTMENT_OTHER)
Admission: EM | Admit: 2015-03-14 | Discharge: 2015-03-14 | Disposition: A | Discharge status: Home / Self Care | Payer: Medicare HMO | Attending: Emergency Medicine | Admitting: Emergency Medicine

## 2015-03-14 ENCOUNTER — Emergency Department (HOSPITAL_BASED_OUTPATIENT_CLINIC_OR_DEPARTMENT_OTHER): Payer: Medicare HMO

## 2015-03-14 DIAGNOSIS — M545 Low back pain: Secondary | ICD-10-CM | POA: Diagnosis not present

## 2015-03-14 DIAGNOSIS — M1990 Unspecified osteoarthritis, unspecified site: Secondary | ICD-10-CM | POA: Diagnosis not present

## 2015-03-14 DIAGNOSIS — I10 Essential (primary) hypertension: Secondary | ICD-10-CM | POA: Insufficient documentation

## 2015-03-14 DIAGNOSIS — I209 Angina pectoris, unspecified: Secondary | ICD-10-CM | POA: Insufficient documentation

## 2015-03-14 DIAGNOSIS — Z87891 Personal history of nicotine dependence: Secondary | ICD-10-CM | POA: Diagnosis not present

## 2015-03-14 DIAGNOSIS — K219 Gastro-esophageal reflux disease without esophagitis: Secondary | ICD-10-CM | POA: Diagnosis not present

## 2015-03-14 DIAGNOSIS — E039 Hypothyroidism, unspecified: Secondary | ICD-10-CM | POA: Diagnosis not present

## 2015-03-14 DIAGNOSIS — Z79899 Other long term (current) drug therapy: Secondary | ICD-10-CM | POA: Diagnosis not present

## 2015-03-14 DIAGNOSIS — F329 Major depressive disorder, single episode, unspecified: Secondary | ICD-10-CM | POA: Insufficient documentation

## 2015-03-14 DIAGNOSIS — Z8541 Personal history of malignant neoplasm of cervix uteri: Secondary | ICD-10-CM | POA: Insufficient documentation

## 2015-03-14 MED ORDER — DIAZEPAM 5 MG PO TABS
5.0000 mg | ORAL_TABLET | Freq: Once | ORAL | Status: AC
  Administered 2015-03-14: 5 mg via ORAL
  Filled 2015-03-14: qty 1

## 2015-03-14 MED ORDER — KETOROLAC TROMETHAMINE 30 MG/ML IJ SOLN
30.0000 mg | Freq: Once | INTRAMUSCULAR | Status: AC
Start: 2015-03-14 — End: 2015-03-14
  Administered 2015-03-14: 30 mg via INTRAVENOUS
  Filled 2015-03-14: qty 1

## 2015-03-14 MED ORDER — DIAZEPAM 5 MG PO TABS: 5.0000 mg | ORAL_TABLET | Freq: Three times a day (TID) | ORAL | Status: AC | PRN

## 2015-03-14 MED ORDER — HYDROMORPHONE HCL 1 MG/ML IJ SOLN
2.0000 mg | Freq: Once | INTRAMUSCULAR | Status: AC
  Administered 2015-03-14: 2 mg via INTRAVENOUS
  Filled 2015-03-14: qty 2

## 2015-03-14 MED ORDER — HYDROMORPHONE HCL 1 MG/ML IJ SOLN
1.0000 mg | Freq: Once | INTRAMUSCULAR | Status: AC
  Administered 2015-03-14: 1 mg via INTRAVENOUS
  Filled 2015-03-14: qty 1

## 2015-03-14 MED FILL — diazePAM 5 MG TABS: 5 | 4 days supply | Qty: 15 | Fill #0

## 2015-03-14 NOTE — ED Provider Notes (Signed)
CSN: [id number]     Arrival date & time 03/14/15  1144 History   First MD Initiated Contact with Patient 03/14/15 1205     Chief Complaint  Patient presents with  . Back Pain     HPI Patient reports bending over today to turn on the shower and developing acute pain in her back.  Her pain is severe.  She denies weakness in her arms or legs.  No recent injury or trauma.  She has a long-standing history of recurrent back pain.  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  She is followed by a chronic pain specialist who prescribes OxyContin as well as breakthrough oxycodone.  She tried oxycodone prior to arrival but reports ongoing severe pain at this time.  No urinary symptoms.  Pain is severe   Past Medical History  Diagnosis Date  . Anginal pain ([place])   . Hypertension   . Hypothyroidism   . Depression   . GERD (gastroesophageal reflux disease)   . Arthritis   . Cancer ([place]) 1981    cervical  . Headache(784.0)     injections every 43months   Past Surgical History  Procedure Laterality Date  . Appendectomy  1970's    ruptured  . Hip surgery Right feb 2014    repaired labral tear, removed spurs, shaved socket, repaired tendon  . 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 Abdominal hysterectomy  1980's  . Endometrial fulguration  70's and 80's    x4  . 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 Social History  Substance Use Topics  . Smoking status: Former Smoker -- 0.50 packs/day for 2 years    Types: Cigarettes    Quit date: 01/16/2003  . Smokeless tobacco: Never Used  . Alcohol Use: Yes     Comment: occasional   OB History    No data available     Review of Systems  All other systems reviewed and are negative.     Allergies  Sulfa  antibiotics  Home Medications   Prior to Admission medications   Medication Sig Start Date End Date Taking? Authorizing Provider  amLODipine-benazepril (LOTREL) 5-10 MG per capsule Take 1 capsule by mouth every morning.   Yes Historical Provider, MD  buPROPion (WELLBUTRIN XL) 300 MG 24 hr tablet Take 300 mg by mouth every morning.   Yes Historical Provider, MD  gabapentin (NEURONTIN) 800 MG tablet Take 800 mg by mouth 2 (two) times daily.   Yes Historical Provider, MD  levothyroxine (SYNTHROID, LEVOTHROID) 25 MCG tablet Take 25 mcg by mouth daily before breakfast.   Yes Historical Provider, MD  oxyCODONE 10 MG TABS Take 1-1.5 tablets (10-15 mg total) by mouth every 4 (four) hours as needed for pain. 11/12/12  Yes Danae Orleans, PA-C  OxyCODONE HCl ER (OXYCONTIN) 60 MG T12A Take 60 mg by mouth 2 (two) times daily at 8 am and 10 pm. 11/12/12  Yes Danae Orleans, PA-C  sucralfate (CARAFATE) 1 GM/10ML suspension Take 1 g by mouth 4 (four) times daily as needed (pain and heart burn).   Yes Historical Provider, MD  B Complex-C (B-COMPLEX WITH VITAMIN C) tablet Take 1 tablet by mouth daily.    Historical Provider, MD  docusate sodium 100 MG CAPS Take 100 mg by mouth 2 (two) times daily. 11/12/12   Danae Orleans, PA-C  ferrous sulfate 325 (65 FE) MG tablet Take 1 tablet (325 mg total) by mouth 3 (three) times daily after meals. 11/12/12   Danae Orleans, PA-C  methocarbamol (ROBAXIN) 500 MG tablet Take 1 tablet (500 mg total) by mouth every 6 (six) hours as needed (muscle spasms). 11/12/12   Danae Orleans, PA-C  sertraline (ZOLOFT) 50 MG tablet Take 100 mg by mouth daily.    Historical Provider, MD  topiramate (TOPAMAX) 200 MG tablet Take 200 mg by mouth at bedtime.    Historical Provider, MD   BP 113/83 mmHg  Pulse 75  Temp(Src) 97.9 F (36.6 C) (Oral)  Resp 20  Ht 5\' 5"  (1.651 m)  Wt 125 lb (56.7 kg)  BMI 20.80 kg/m2  SpO2 100% Physical Exam  Constitutional: She is oriented to person,  place, and time. She appears well-developed and well-nourished.  HENT:  Head: Normocephalic.  Eyes: EOM are normal.  Neck: Normal range of motion.  Pulmonary/Chest: Effort normal.  Abdominal: She exhibits no distension.  Musculoskeletal:  No thoracic or lumbar point tenderness.  Parathoracic spasm and tenderness on the left.  Normal strength in bilateral lower extremity major muscle groups.  Neurological: She is alert and oriented to person, place, and time.  Psychiatric: She has a normal mood and affect.  Nursing note and vitals reviewed.   ED Course  Procedures (including critical care time) Labs Review Labs Reviewed - No data to display  Imaging Review Dg Lumbar Spine Complete  03/14/2015  CLINICAL DATA:  65 year old female with severe lumbar spine pain after bending over in the shower EXAM: LUMBAR SPINE - COMPLETE 4+ VIEW COMPARISON:  Prior MRI of the lumbar spine 08/15/2011 FINDINGS: There is no evidence of lumbar spine fracture. Alignment is normal. Intervertebral disc spaces are maintained. Mild L5-S1 facet arthropathy. IMPRESSION: 1. No evidence of acute fracture, malalignment or significant degenerative disc disease. 2. Mild L5-S1 facet arthropathy. 3. Incompletely imaged right total hip arthroplasty prosthesis. Electronically Signed   By: Jacqulynn Cadet M.D.   On: 03/14/2015 13:36   I have personally reviewed and evaluated these images and lab results as part of my medical decision-making.   EKG Interpretation None      MDM   Final diagnoses:  Low back pain without sciatica, unspecified back pain laterality    3:28 PM Feels much better this time.  Cam Hai in the emergency department.  She'll follow-up with her pain specialist.  If her pain persists she will need repeat MRI of her low back.  She's instructed to return to the emergency department for new or worsening symptoms.  All questions answered.    Jola Schmidt, MD 03/14/15 [phone]

## 2015-03-14 NOTE — ED Notes (Addendum)
Pt sts she bent over in the shower this morning and could not stand up. Pt now c/o low center back pain now and cannot get comfortable. Pt sts she has significant pressure in her lower back when she sits. Pt sts it feels the same as a previous cyst that she had surgery to remove. Pt sts she took her oxycodone prior to arrival.

## 2015-03-14 NOTE — ED Notes (Signed)
MD at bedside. 

## 2015-03-14 NOTE — Discharge Instructions (Signed)

## 2016-04-09 ENCOUNTER — Other Ambulatory Visit: Payer: Self-pay | Admitting: Family Medicine

## 2016-04-09 DIAGNOSIS — Z1231 Encounter for screening mammogram for malignant neoplasm of breast: Secondary | ICD-10-CM

## 2016-05-01 ENCOUNTER — Ambulatory Visit
Admission: RE | Admit: 2016-05-01 | Discharge: 2016-05-01 | Disposition: A | Discharge status: Home / Self Care | Payer: Medicare Other | Source: Ambulatory Visit | Attending: Family Medicine | Admitting: Family Medicine

## 2016-05-01 ENCOUNTER — Other Ambulatory Visit: Payer: Self-pay | Admitting: Family Medicine

## 2016-05-01 DIAGNOSIS — Z1231 Encounter for screening mammogram for malignant neoplasm of breast: Secondary | ICD-10-CM

## 2017-06-11 ENCOUNTER — Other Ambulatory Visit: Payer: Self-pay | Admitting: Family Medicine

## 2017-06-11 DIAGNOSIS — Z1231 Encounter for screening mammogram for malignant neoplasm of breast: Secondary | ICD-10-CM

## 2017-07-01 ENCOUNTER — Ambulatory Visit
Admission: RE | Admit: 2017-07-01 | Discharge: 2017-07-01 | Disposition: A | Discharge status: Home / Self Care | Payer: Medicare HMO | Source: Ambulatory Visit | Attending: Family Medicine | Admitting: Family Medicine

## 2017-07-01 DIAGNOSIS — Z1231 Encounter for screening mammogram for malignant neoplasm of breast: Secondary | ICD-10-CM

## 2018-10-23 ENCOUNTER — Other Ambulatory Visit: Payer: Self-pay | Admitting: Family Medicine

## 2018-10-23 DIAGNOSIS — Z1231 Encounter for screening mammogram for malignant neoplasm of breast: Secondary | ICD-10-CM

## 2018-12-22 ENCOUNTER — Other Ambulatory Visit: Payer: Self-pay

## 2018-12-22 ENCOUNTER — Ambulatory Visit
Admission: RE | Admit: 2018-12-22 | Discharge: 2018-12-22 | Disposition: A | Discharge status: Home / Self Care | Payer: Medicare HMO | Source: Ambulatory Visit | Attending: Family Medicine | Admitting: Family Medicine

## 2018-12-22 DIAGNOSIS — Z1231 Encounter for screening mammogram for malignant neoplasm of breast: Secondary | ICD-10-CM

## 2020-02-16 ENCOUNTER — Other Ambulatory Visit: Payer: Self-pay | Admitting: Family Medicine

## 2020-02-16 ENCOUNTER — Other Ambulatory Visit: Payer: Self-pay | Admitting: Internal Medicine

## 2020-02-16 DIAGNOSIS — Z1231 Encounter for screening mammogram for malignant neoplasm of breast: Secondary | ICD-10-CM

## 2020-04-01 ENCOUNTER — Ambulatory Visit
Admission: RE | Admit: 2020-04-01 | Discharge: 2020-04-01 | Disposition: A | Discharge status: Home / Self Care | Payer: Medicare HMO | Source: Ambulatory Visit | Attending: Internal Medicine | Admitting: Internal Medicine

## 2020-04-01 ENCOUNTER — Other Ambulatory Visit: Payer: Self-pay

## 2020-04-01 DIAGNOSIS — Z1231 Encounter for screening mammogram for malignant neoplasm of breast: Secondary | ICD-10-CM

## 2021-05-17 ENCOUNTER — Other Ambulatory Visit: Payer: Self-pay | Admitting: Internal Medicine

## 2021-05-17 DIAGNOSIS — Z1231 Encounter for screening mammogram for malignant neoplasm of breast: Secondary | ICD-10-CM

## 2021-05-19 ENCOUNTER — Ambulatory Visit: Payer: Medicare HMO

## 2021-05-30 ENCOUNTER — Ambulatory Visit
Admission: RE | Admit: 2021-05-30 | Discharge: 2021-05-30 | Disposition: A | Discharge status: Home / Self Care | Payer: Medicare HMO | Source: Ambulatory Visit | Attending: Internal Medicine | Admitting: Internal Medicine

## 2021-05-30 ENCOUNTER — Ambulatory Visit: Payer: Medicare HMO

## 2021-05-30 DIAGNOSIS — Z1231 Encounter for screening mammogram for malignant neoplasm of breast: Secondary | ICD-10-CM

## 2021-06-01 ENCOUNTER — Other Ambulatory Visit: Payer: Self-pay | Admitting: Internal Medicine

## 2021-06-01 DIAGNOSIS — R928 Other abnormal and inconclusive findings on diagnostic imaging of breast: Secondary | ICD-10-CM

## 2021-06-09 ENCOUNTER — Other Ambulatory Visit: Payer: Self-pay | Admitting: Internal Medicine

## 2021-06-09 ENCOUNTER — Ambulatory Visit
Admission: RE | Admit: 2021-06-09 | Discharge: 2021-06-09 | Disposition: A | Discharge status: Home / Self Care | Payer: Medicare HMO | Source: Ambulatory Visit | Attending: Internal Medicine | Admitting: Internal Medicine

## 2021-06-09 ENCOUNTER — Ambulatory Visit: Payer: Medicare HMO

## 2021-06-09 DIAGNOSIS — R928 Other abnormal and inconclusive findings on diagnostic imaging of breast: Secondary | ICD-10-CM

## 2022-08-13 ENCOUNTER — Other Ambulatory Visit: Payer: Self-pay | Admitting: Internal Medicine

## 2022-08-13 DIAGNOSIS — Z1231 Encounter for screening mammogram for malignant neoplasm of breast: Secondary | ICD-10-CM

## 2022-08-22 ENCOUNTER — Ambulatory Visit
Admission: RE | Admit: 2022-08-22 | Discharge: 2022-08-22 | Disposition: A | Discharge status: Home / Self Care | Payer: Medicare HMO | Source: Ambulatory Visit | Attending: Internal Medicine | Admitting: Internal Medicine

## 2022-08-22 DIAGNOSIS — Z1231 Encounter for screening mammogram for malignant neoplasm of breast: Secondary | ICD-10-CM

## 2023-10-11 ENCOUNTER — Other Ambulatory Visit: Payer: Self-pay | Admitting: Internal Medicine

## 2023-10-11 DIAGNOSIS — Z0000 Encounter for general adult medical examination without abnormal findings: Secondary | ICD-10-CM

## 2023-10-29 ENCOUNTER — Ambulatory Visit

## 2023-11-07 ENCOUNTER — Ambulatory Visit
Admission: RE | Admit: 2023-11-07 | Discharge: 2023-11-07 | Disposition: A | Source: Ambulatory Visit | Attending: Internal Medicine | Admitting: Internal Medicine

## 2023-11-07 DIAGNOSIS — Z0000 Encounter for general adult medical examination without abnormal findings: Secondary | ICD-10-CM

## 2024-01-10 IMAGING — MG MM DIGITAL DIAGNOSTIC UNILAT*L* W/ TOMO W/ CAD
6 series · 6 of 18 positions shown · non-contrast
Comparison: Previous exam(s).

CLINICAL DATA: 70-year-old female presenting as a recall from
screening for possible left breast asymmetry.

EXAM:
DIGITAL DIAGNOSTIC UNILATERAL LEFT MAMMOGRAM WITH TOMOSYNTHESIS AND
CAD
TECHNIQUE: Left digital diagnostic mammography and breast tomosynthesis was
performed. The images were evaluated with computer-aided detection.

[L CC synth-2D (1 of 2)]
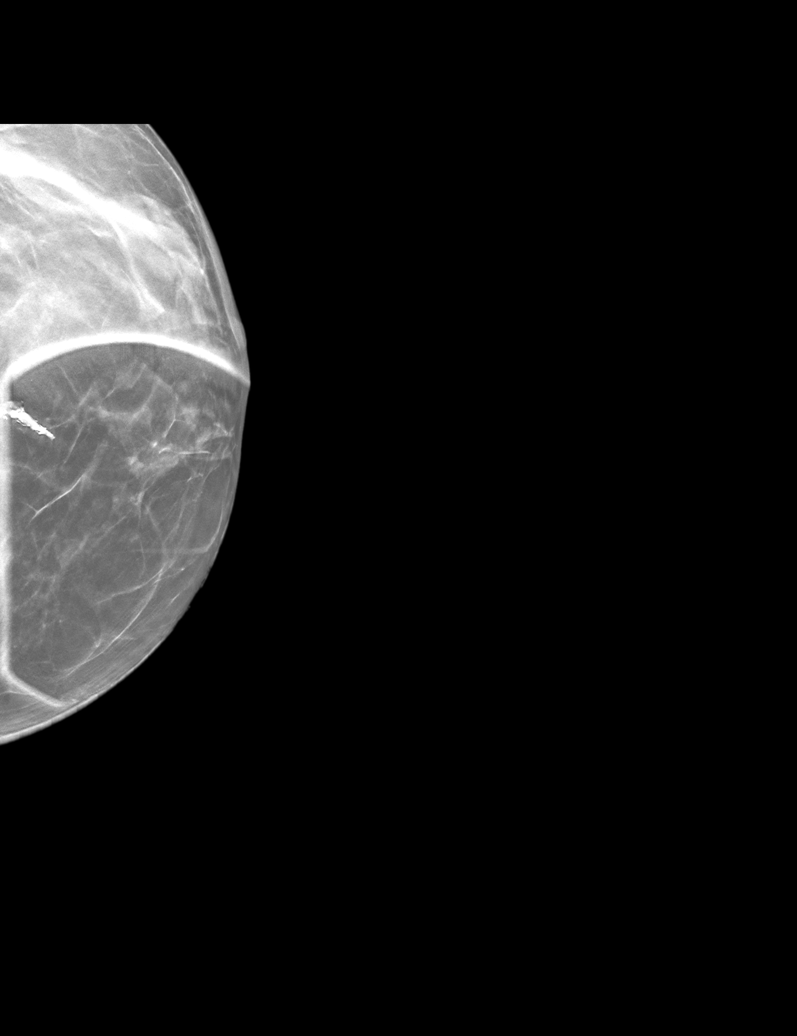

[L ML synth-2D]
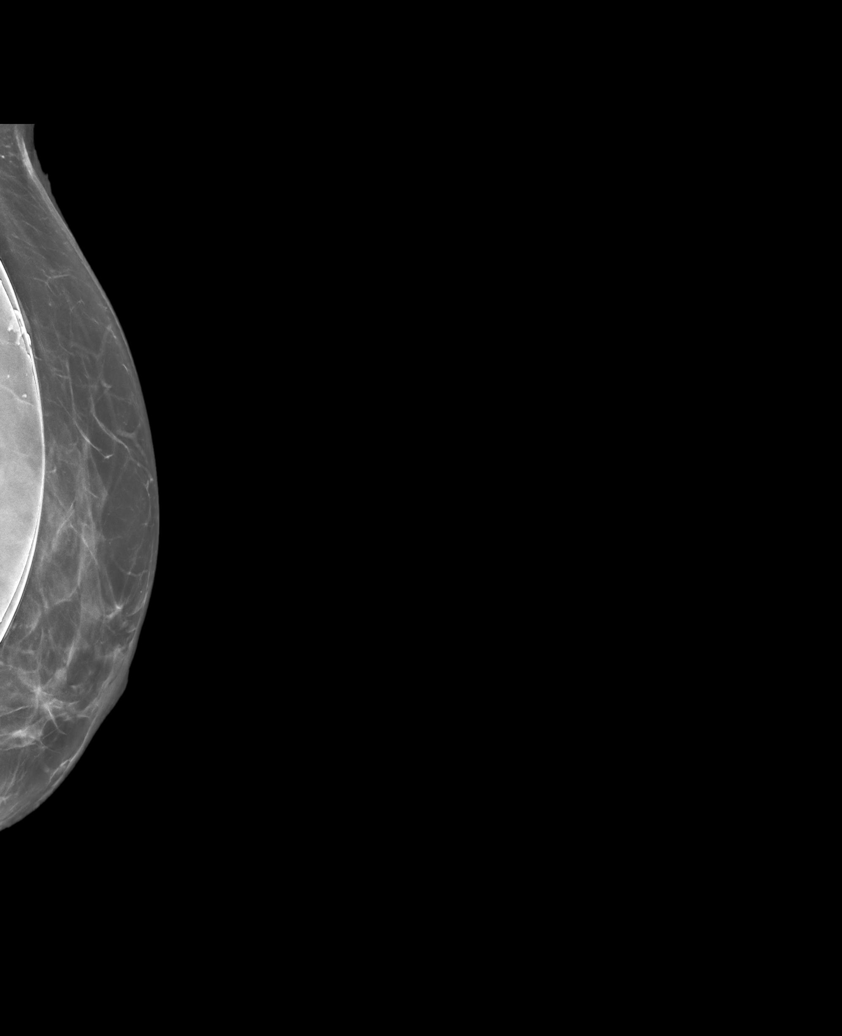

[L CC synth-2D (2 of 2)]
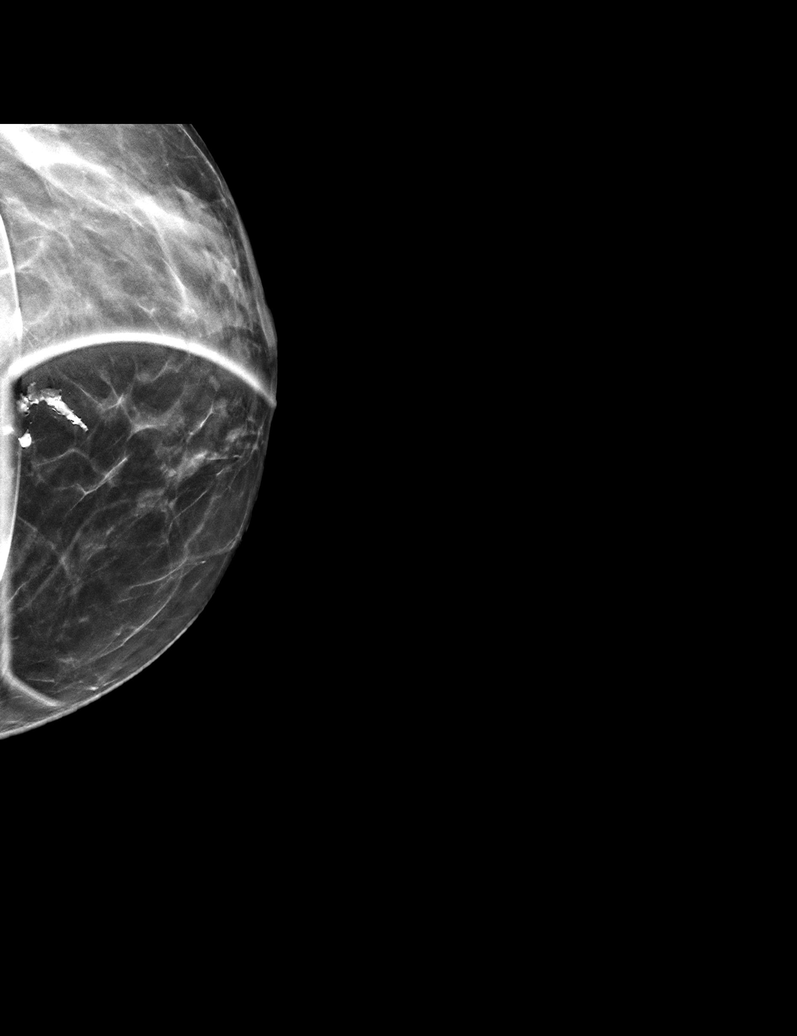

[L MLID BREAST TOMOSYNTHESIS IMAGE tomo · tomo slice 35/69.0]
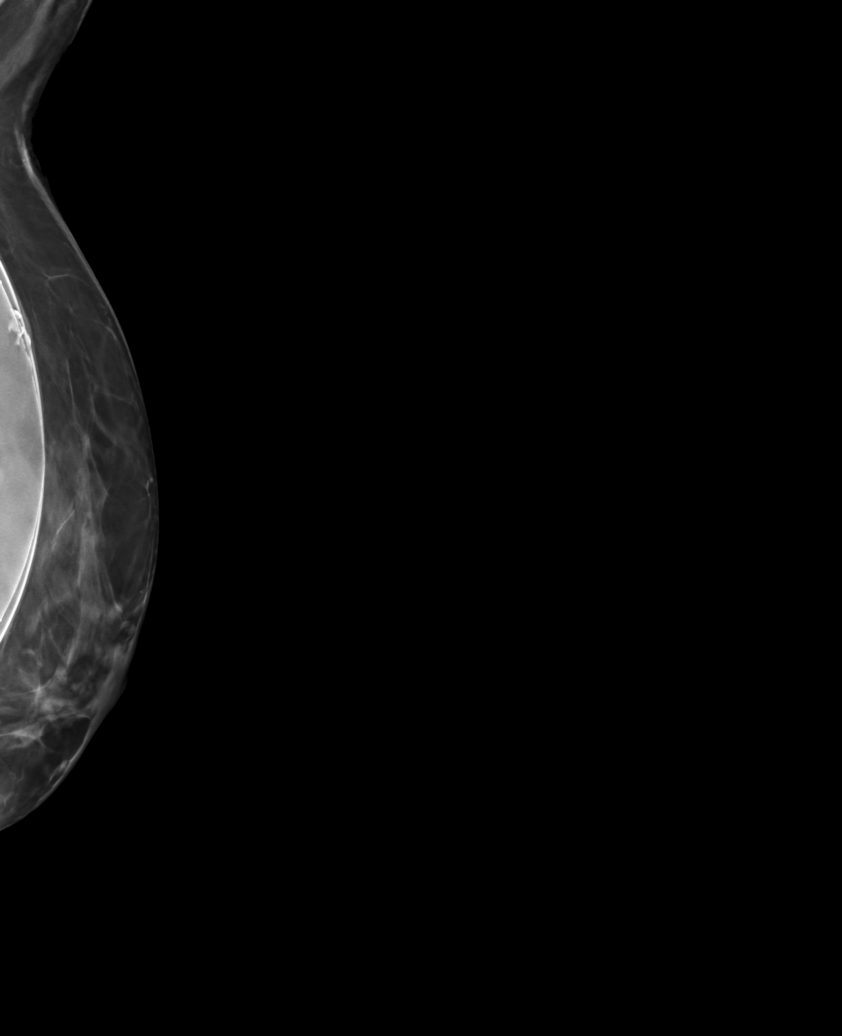

[L CCID BREAST TOMOSYNTHESIS IMAGE tomo (1 of 2) · tomo slice 21/40.0]
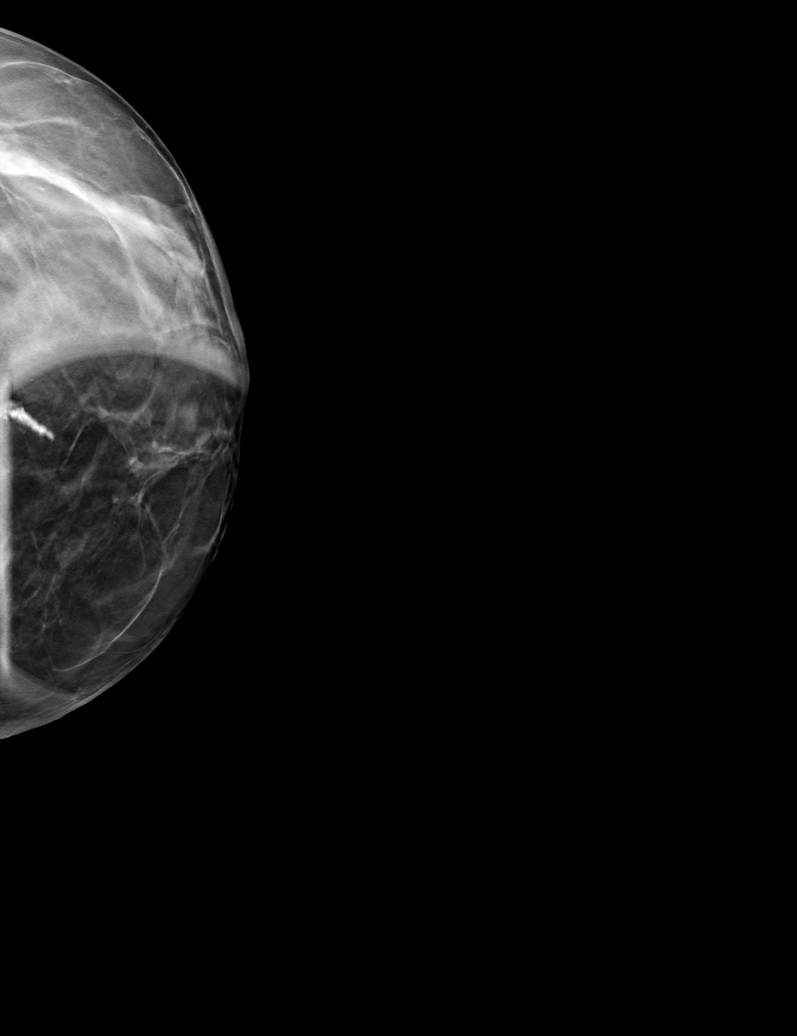

[L CCID BREAST TOMOSYNTHESIS IMAGE tomo (2 of 2) · tomo slice 23/45.0]
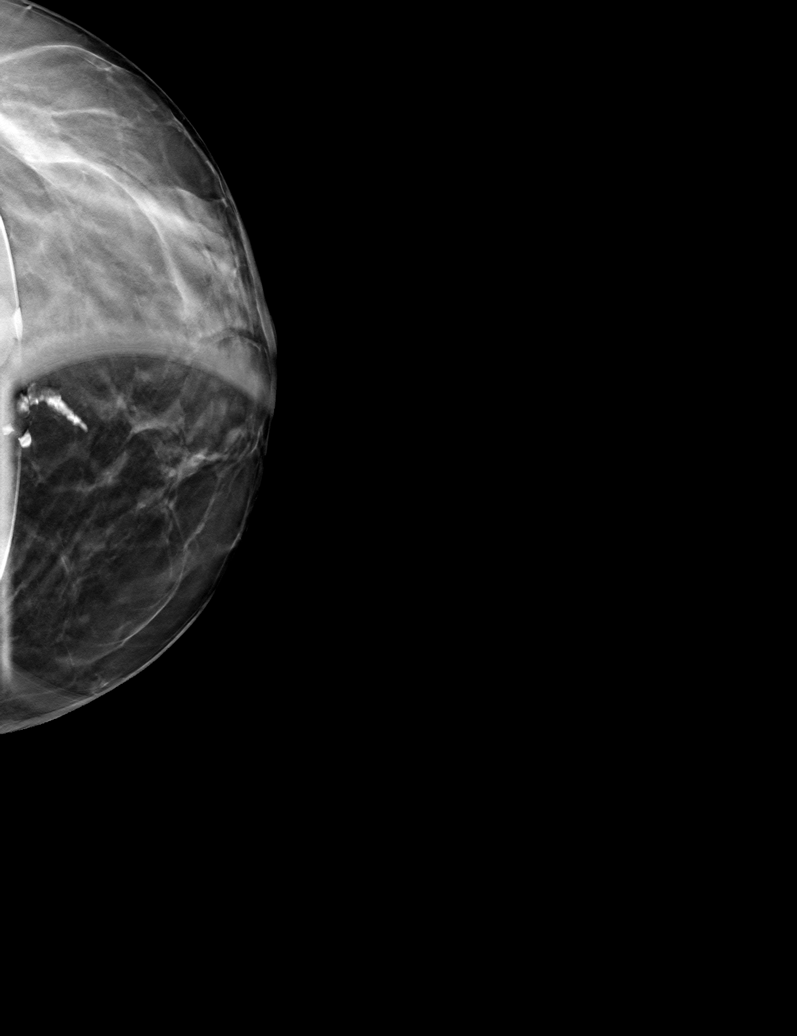

[6 of 18 positions shown; findings below may reference images not displayed]

ACR Breast Density Category c: The breast tissue is heterogeneously
dense, which may obscure small masses.
FINDINGS: Implant displaced cc spot compression tomosynthesis views as well as
full true lateral views were performed for a questioned asymmetry
seen only on CC view in the medial posterior left breast. On the
additional imaging the tissue in this area disperses without
persistent asymmetry, mass, or true distortion. The finding on
screening mammogram most likely represented normal clumped
fibroglandular tissue.
IMPRESSION: No mammographic evidence of malignancy in the left breast.

RECOMMENDATION:
Screening mammogram in one year.(Code:CX-3-3YL)

I have discussed the findings and recommendations with the patient.
If applicable, a reminder letter will be sent to the patient
regarding the next appointment.

BI-RADS CATEGORY  1: Negative.
